# Patient Record
Sex: Female | Born: 1960
Health system: Southern US, Community
[De-identification: ages and names within clinical notes are randomized; demographics above are authoritative.]

## PROBLEM LIST (undated history)

## (undated) ENCOUNTER — Emergency Department (HOSPITAL_COMMUNITY): Disposition: A | Discharge status: Home / Self Care | Payer: BC Managed Care – PPO

## (undated) DIAGNOSIS — M1990 Unspecified osteoarthritis, unspecified site: Secondary | ICD-10-CM

## (undated) DIAGNOSIS — T7840XA Allergy, unspecified, initial encounter: Secondary | ICD-10-CM

## (undated) DIAGNOSIS — M810 Age-related osteoporosis without current pathological fracture: Secondary | ICD-10-CM

## (undated) DIAGNOSIS — I8390 Asymptomatic varicose veins of unspecified lower extremity: Secondary | ICD-10-CM

## (undated) HISTORY — PX: COLPOSCOPY: SHX161

## (undated) HISTORY — DX: Unspecified osteoarthritis, unspecified site: M19.90

## (undated) HISTORY — DX: Asymptomatic varicose veins of unspecified lower extremity: I83.90

## (undated) HISTORY — PX: ENDOMETRIAL ABLATION: SHX621

## (undated) HISTORY — DX: Age-related osteoporosis without current pathological fracture: M81.0

## (undated) HISTORY — DX: Allergy, unspecified, initial encounter: T78.40XA

---

## 1994-11-21 HISTORY — PX: PELVIC LAPAROSCOPY: SHX162

## 2002-02-18 ENCOUNTER — Other Ambulatory Visit: Admission: RE | Admit: 2002-02-18 | Discharge: 2002-02-18 | Payer: Self-pay | Admitting: Obstetrics and Gynecology

## 2003-03-19 ENCOUNTER — Other Ambulatory Visit: Admission: RE | Admit: 2003-03-19 | Discharge: 2003-03-19 | Payer: Self-pay | Admitting: Obstetrics and Gynecology

## 2004-03-31 ENCOUNTER — Other Ambulatory Visit: Admission: RE | Admit: 2004-03-31 | Discharge: 2004-03-31 | Payer: Self-pay | Admitting: Obstetrics and Gynecology

## 2004-05-04 ENCOUNTER — Other Ambulatory Visit: Admission: RE | Admit: 2004-05-04 | Discharge: 2004-05-04 | Payer: Self-pay | Admitting: Obstetrics and Gynecology

## 2004-11-01 ENCOUNTER — Other Ambulatory Visit: Admission: RE | Admit: 2004-11-01 | Discharge: 2004-11-01 | Payer: Self-pay | Admitting: Obstetrics and Gynecology

## 2005-01-03 ENCOUNTER — Encounter: Admission: RE | Admit: 2005-01-03 | Discharge: 2005-01-03 | Payer: Self-pay | Admitting: Family Medicine

## 2005-04-04 ENCOUNTER — Other Ambulatory Visit: Admission: RE | Admit: 2005-04-04 | Discharge: 2005-04-04 | Payer: Self-pay | Admitting: Obstetrics and Gynecology

## 2006-01-11 ENCOUNTER — Encounter: Admission: RE | Admit: 2006-01-11 | Discharge: 2006-01-11 | Payer: Self-pay | Admitting: Family Medicine

## 2006-04-10 ENCOUNTER — Other Ambulatory Visit: Admission: RE | Admit: 2006-04-10 | Discharge: 2006-04-10 | Payer: Self-pay | Admitting: Obstetrics and Gynecology

## 2007-02-05 ENCOUNTER — Encounter: Admission: RE | Admit: 2007-02-05 | Discharge: 2007-02-05 | Payer: Self-pay | Admitting: Obstetrics and Gynecology

## 2007-04-12 ENCOUNTER — Other Ambulatory Visit: Admission: RE | Admit: 2007-04-12 | Discharge: 2007-04-12 | Payer: Self-pay | Admitting: Obstetrics and Gynecology

## 2008-04-15 ENCOUNTER — Encounter: Admission: RE | Admit: 2008-04-15 | Discharge: 2008-04-15 | Payer: Self-pay | Admitting: Obstetrics and Gynecology

## 2008-04-15 ENCOUNTER — Other Ambulatory Visit: Admission: RE | Admit: 2008-04-15 | Discharge: 2008-04-15 | Payer: Self-pay | Admitting: Obstetrics and Gynecology

## 2009-05-22 ENCOUNTER — Other Ambulatory Visit: Admission: RE | Admit: 2009-05-22 | Discharge: 2009-05-22 | Payer: Self-pay | Admitting: Obstetrics and Gynecology

## 2009-05-22 ENCOUNTER — Encounter: Admission: RE | Admit: 2009-05-22 | Discharge: 2009-05-22 | Payer: Self-pay | Admitting: Obstetrics and Gynecology

## 2009-05-22 ENCOUNTER — Ambulatory Visit: Payer: Self-pay | Admitting: Obstetrics and Gynecology

## 2009-05-22 ENCOUNTER — Encounter: Payer: Self-pay | Admitting: Obstetrics and Gynecology

## 2009-11-21 HISTORY — PX: OTHER SURGICAL HISTORY: SHX169

## 2010-02-08 ENCOUNTER — Encounter: Payer: Self-pay | Admitting: Cardiology

## 2010-06-11 ENCOUNTER — Other Ambulatory Visit: Admission: RE | Admit: 2010-06-11 | Discharge: 2010-06-11 | Payer: Self-pay | Admitting: Obstetrics and Gynecology

## 2010-06-11 ENCOUNTER — Ambulatory Visit: Payer: Self-pay | Admitting: Obstetrics and Gynecology

## 2010-09-02 ENCOUNTER — Encounter: Admission: RE | Admit: 2010-09-02 | Discharge: 2010-09-02 | Payer: Self-pay | Admitting: Obstetrics and Gynecology

## 2010-12-21 NOTE — Letter (Signed)
Summary: [facility] 2.1.11 PROGRESS NOTE  [facility] 2.1.11 PROGRESS NOTE   Imported By: Faythe Ghee 02/08/2010 16:36:38  _____________________________________________________________________  External Attachment:    Type:   Image     Comment:   External Document

## 2011-03-04 ENCOUNTER — Other Ambulatory Visit: Payer: Self-pay | Admitting: Dermatology

## 2011-04-05 ENCOUNTER — Other Ambulatory Visit: Payer: Self-pay | Admitting: Dermatology

## 2011-07-26 ENCOUNTER — Other Ambulatory Visit: Payer: Self-pay | Admitting: Obstetrics and Gynecology

## 2011-07-26 DIAGNOSIS — Z1231 Encounter for screening mammogram for malignant neoplasm of breast: Secondary | ICD-10-CM

## 2011-07-27 ENCOUNTER — Encounter: Payer: Self-pay | Admitting: Obstetrics and Gynecology

## 2011-07-27 ENCOUNTER — Ambulatory Visit (INDEPENDENT_AMBULATORY_CARE_PROVIDER_SITE_OTHER): Payer: BC Managed Care – PPO | Admitting: Obstetrics and Gynecology

## 2011-07-27 ENCOUNTER — Other Ambulatory Visit (HOSPITAL_COMMUNITY)
Admission: RE | Admit: 2011-07-27 | Discharge: 2011-07-27 | Disposition: A | Discharge status: Home / Self Care | Payer: BC Managed Care – PPO | Source: Ambulatory Visit | Attending: Obstetrics and Gynecology | Admitting: Obstetrics and Gynecology

## 2011-07-27 VITALS — BP 106/68 | Ht 67.5 in | Wt 189.0 lb

## 2011-07-27 DIAGNOSIS — Z01419 Encounter for gynecological examination (general) (routine) without abnormal findings: Secondary | ICD-10-CM

## 2011-07-27 DIAGNOSIS — Z13220 Encounter for screening for lipoid disorders: Secondary | ICD-10-CM

## 2011-07-27 DIAGNOSIS — N951 Menopausal and female climacteric states: Secondary | ICD-10-CM

## 2011-07-27 DIAGNOSIS — Z780 Asymptomatic menopausal state: Secondary | ICD-10-CM

## 2011-07-27 DIAGNOSIS — R5383 Other fatigue: Secondary | ICD-10-CM

## 2011-07-27 DIAGNOSIS — N809 Endometriosis, unspecified: Secondary | ICD-10-CM | POA: Insufficient documentation

## 2011-07-27 NOTE — Progress Notes (Signed)
Patient came to see me today for an annual GYN exam. Her periods have been nonexistent since she had her endometrial ablation. She does have occasional spotting. Her big issues today  however are severe hot flashes and fatigue. She is up-to-date on mammograms.  Physical examination: HEENT within normal limits. Neck: Thyroid not large. No masses. Supraclavicular nodes: not enlarged. Breasts: Examined in both sitting midline position. No skin changes and no masses. Abdomen: Soft no guarding rebound or masses or hernia. Pelvic: External: Within normal limits. BUS: Within normal limits. Vaginal:within normal limits. Good estrogen effect. No evidence of cystocele rectocele or enterocele. Cervix: clean. Uterus: Normal size and shape. Adnexa: No masses. Rectovaginal exam: Confirmatory and negative. Extremities: Within normal limits.  Assessment: Hot flashes and fatigue  Plan: Appropriate lab work done. Patient to call me for results.

## 2011-07-27 NOTE — Patient Instructions (Signed)
Call for lab results. Hit 9.

## 2011-07-29 ENCOUNTER — Telehealth: Payer: Self-pay | Admitting: Obstetrics and Gynecology

## 2011-07-29 DIAGNOSIS — Z780 Asymptomatic menopausal state: Secondary | ICD-10-CM

## 2011-07-29 MED ORDER — MEDROXYPROGESTERONE ACETATE 10 MG PO TABS: ORAL_TABLET | ORAL | Status: DC

## 2011-07-29 MED ORDER — ESTRADIOL 1 MG PO TABS: 0.5000 mg | ORAL_TABLET | Freq: Every day | ORAL | Status: DC

## 2011-07-29 NOTE — Telephone Encounter (Signed)
I spoke with the patient today. Although she is now menopausal her FSH is 17. We discussed treatment because she is extremely fatigue as well as having hot flashes. Her thyroid was normal. We treated her with estradiol half a milligram daily. We also gave her medroxyprogesterone 5 mg day 1 the 12. Prescriptions called in.

## 2011-09-05 ENCOUNTER — Ambulatory Visit: Payer: Self-pay

## 2011-09-07 ENCOUNTER — Ambulatory Visit
Admission: RE | Admit: 2011-09-07 | Discharge: 2011-09-07 | Disposition: A | Discharge status: Home / Self Care | Payer: BC Managed Care – PPO | Source: Ambulatory Visit | Attending: Obstetrics and Gynecology | Admitting: Obstetrics and Gynecology

## 2011-09-07 DIAGNOSIS — Z1231 Encounter for screening mammogram for malignant neoplasm of breast: Secondary | ICD-10-CM

## 2011-11-10 IMAGING — MG MM DIGITAL SCREENING
4 series · 4 of 4 positions shown · non-contrast
Comparison: none

DG SCREEN MAMMOGRAM BILATERAL
Bilateral CC and MLO view(s) were taken.

DIGITAL SCREENING MAMMOGRAM WITH CAD:
The breast tissue is heterogeneously dense.  No masses or malignant type calcifications are 
identified.  Compared with prior studies.
Images were processed with CAD.

[R CC]
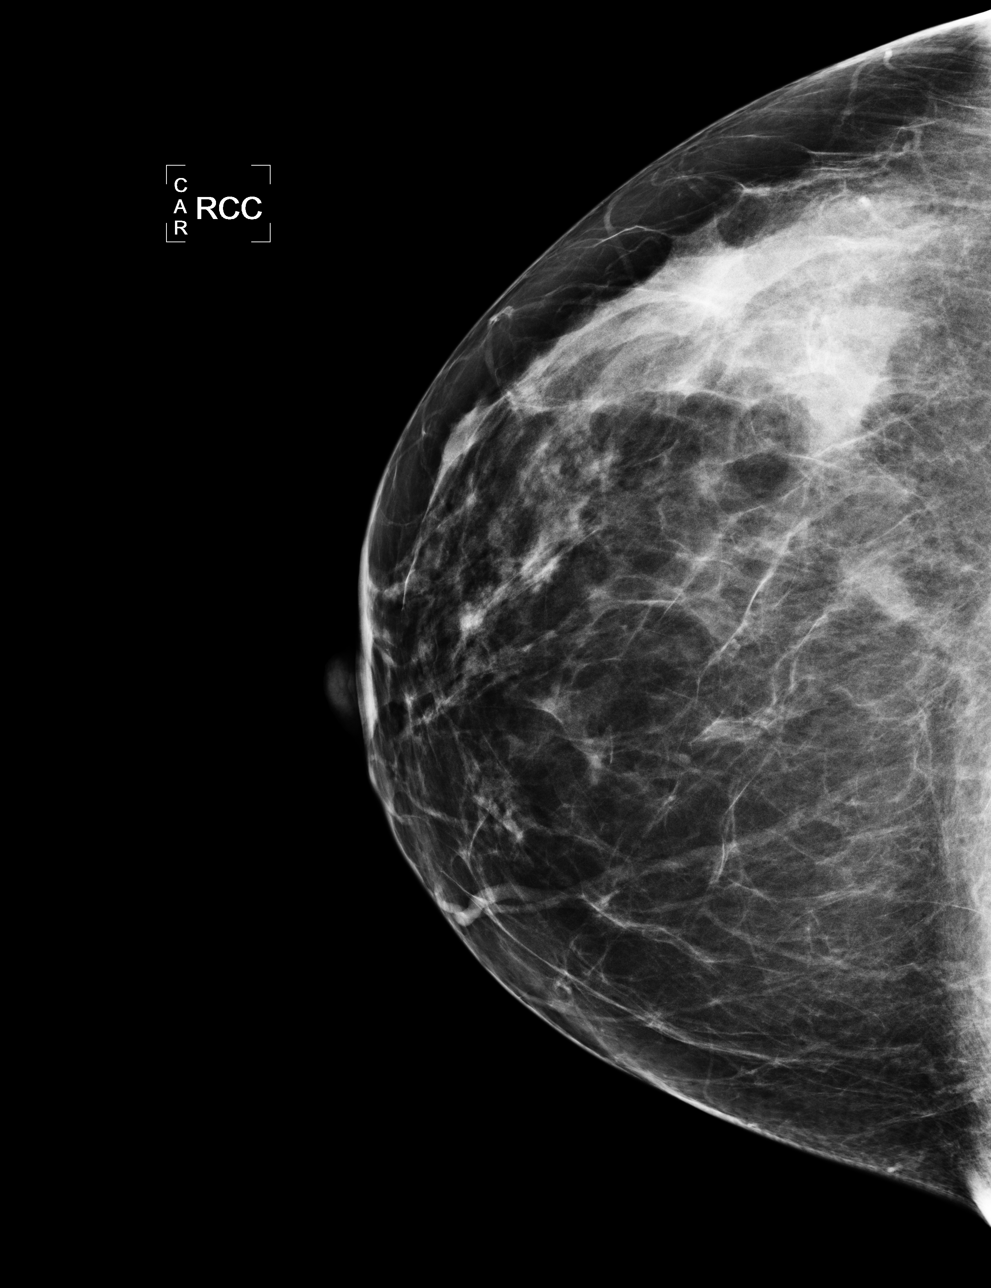

[L CC]
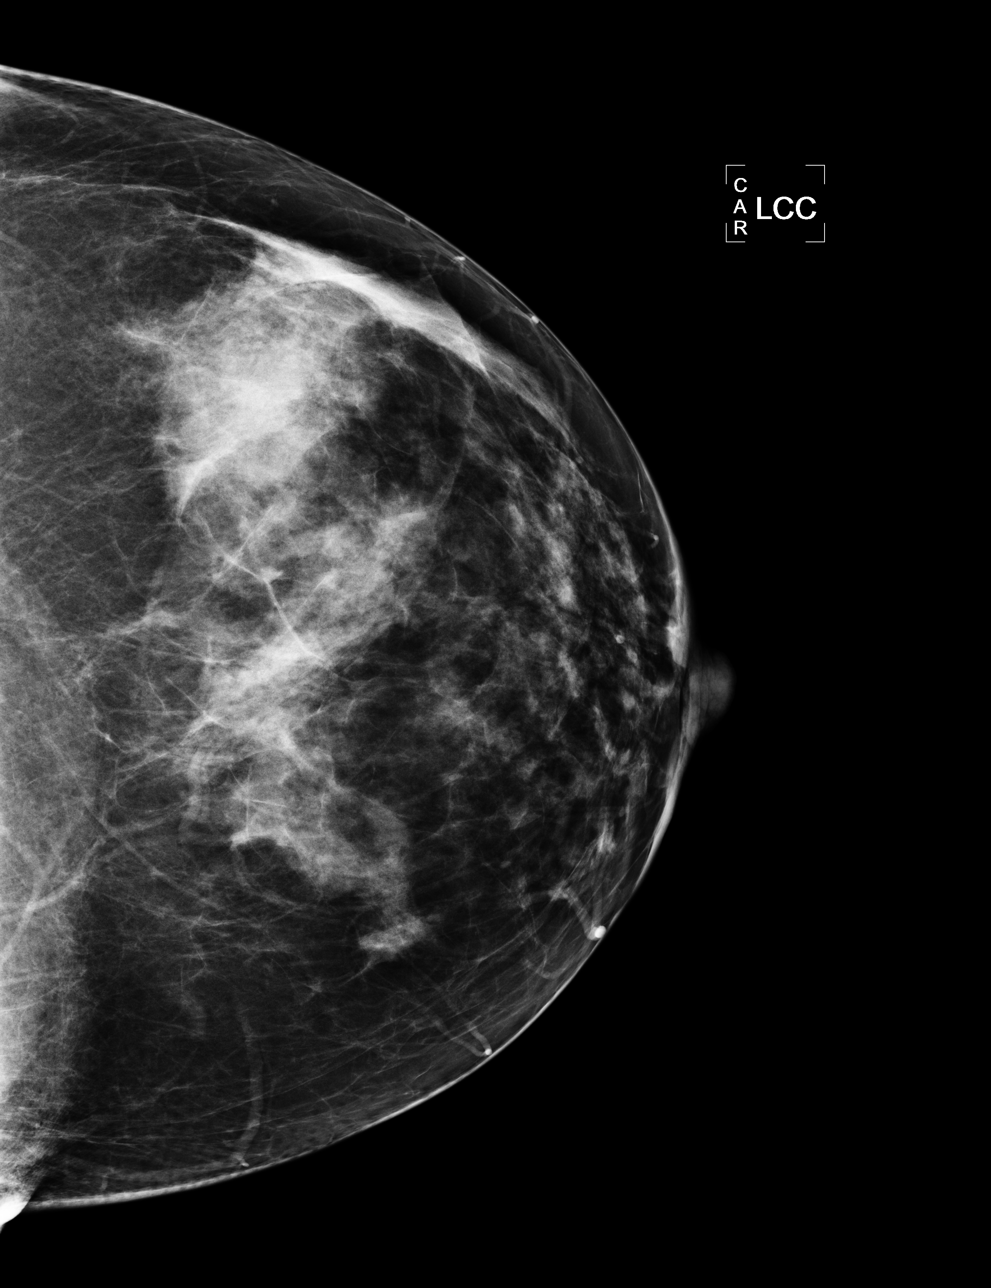

[L MLO]
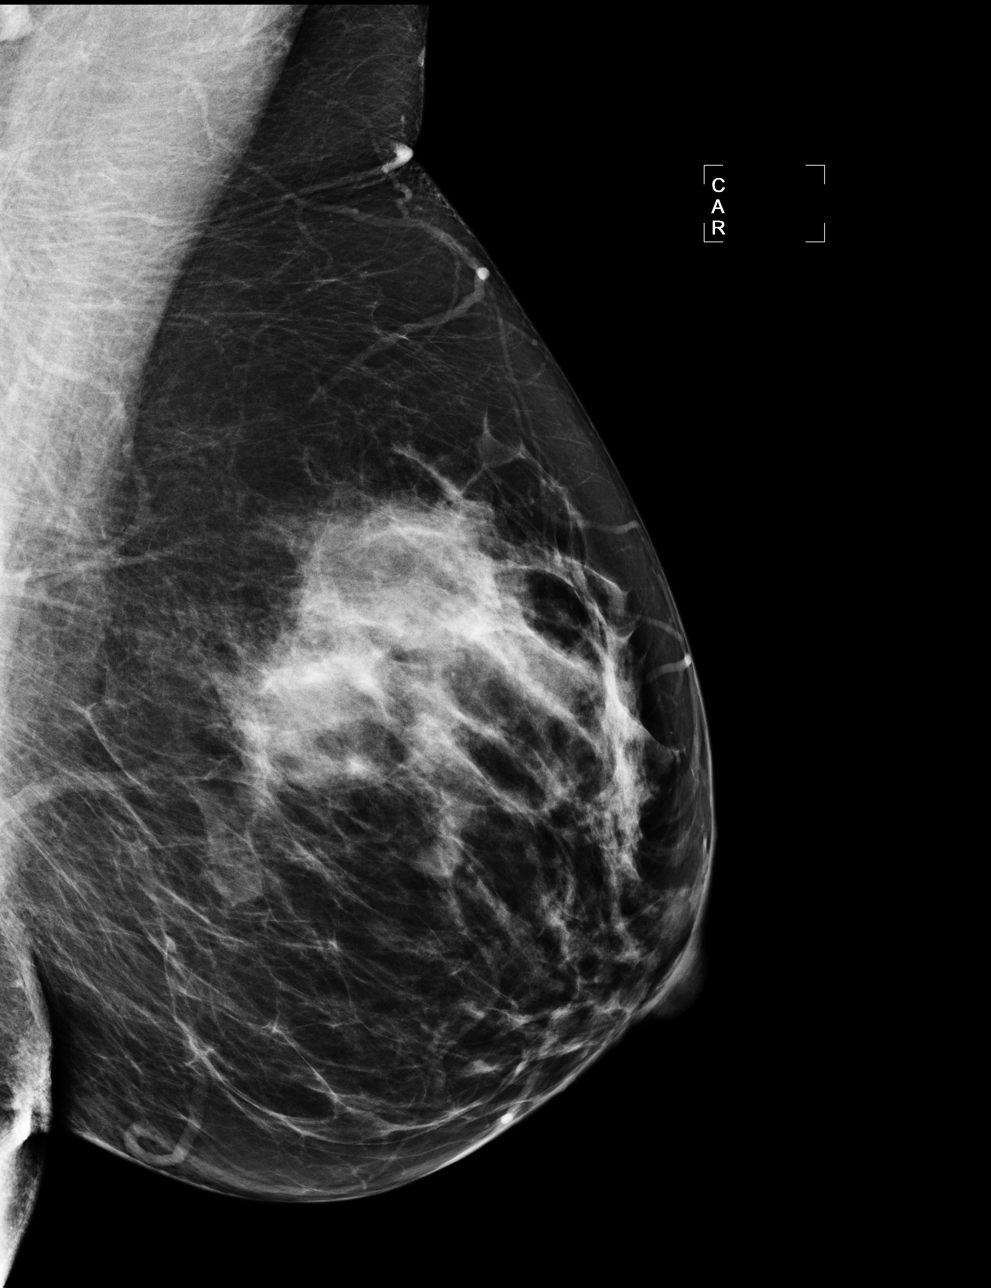

[R MLO]
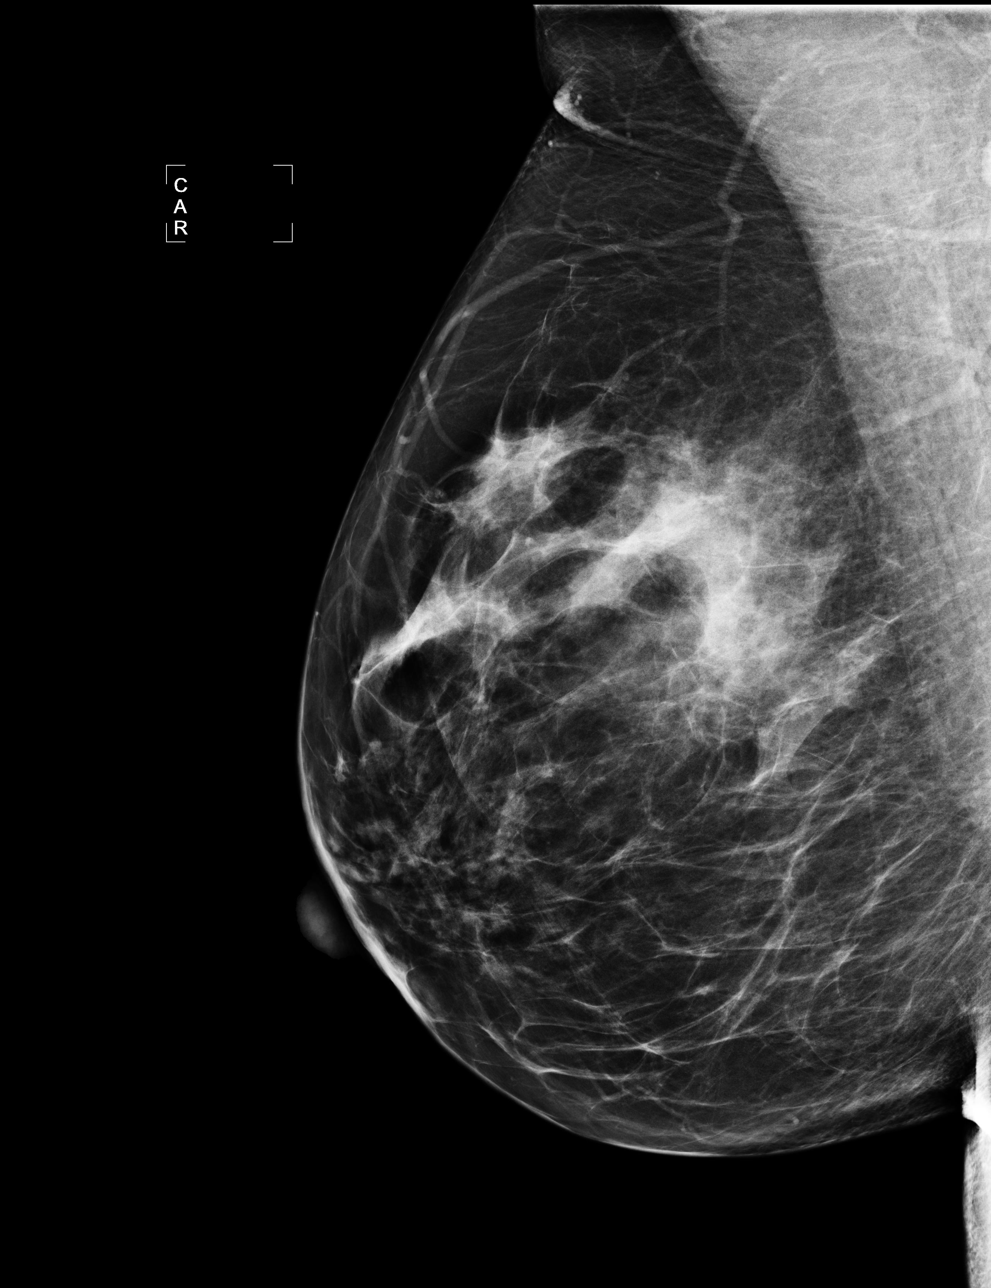

[4 of 4 positions shown; findings below may reference images not displayed]

IMPRESSION: No specific mammographic evidence of malignancy.  Next screening mammogram is recommended in one 
year.

A result letter of this screening mammogram will be mailed directly to the patient.

ASSESSMENT: Negative - BI-RADS 1

Screening mammogram in 1 year.
,

## 2012-07-09 ENCOUNTER — Other Ambulatory Visit: Payer: Self-pay | Admitting: Dermatology

## 2012-08-23 ENCOUNTER — Other Ambulatory Visit (HOSPITAL_COMMUNITY)
Admission: RE | Admit: 2012-08-23 | Discharge: 2012-08-23 | Disposition: A | Discharge status: Home / Self Care | Payer: BC Managed Care – PPO | Source: Ambulatory Visit | Attending: Obstetrics and Gynecology | Admitting: Obstetrics and Gynecology

## 2012-08-23 ENCOUNTER — Encounter: Payer: Self-pay | Admitting: Obstetrics and Gynecology

## 2012-08-23 ENCOUNTER — Ambulatory Visit (INDEPENDENT_AMBULATORY_CARE_PROVIDER_SITE_OTHER): Payer: BC Managed Care – PPO | Admitting: Obstetrics and Gynecology

## 2012-08-23 VITALS — BP 122/78 | Ht 67.0 in | Wt 174.0 lb

## 2012-08-23 DIAGNOSIS — Z833 Family history of diabetes mellitus: Secondary | ICD-10-CM

## 2012-08-23 DIAGNOSIS — F329 Major depressive disorder, single episode, unspecified: Secondary | ICD-10-CM | POA: Insufficient documentation

## 2012-08-23 DIAGNOSIS — N898 Other specified noninflammatory disorders of vagina: Secondary | ICD-10-CM

## 2012-08-23 DIAGNOSIS — Z780 Asymptomatic menopausal state: Secondary | ICD-10-CM

## 2012-08-23 DIAGNOSIS — Z01419 Encounter for gynecological examination (general) (routine) without abnormal findings: Secondary | ICD-10-CM

## 2012-08-23 DIAGNOSIS — N83209 Unspecified ovarian cyst, unspecified side: Secondary | ICD-10-CM | POA: Insufficient documentation

## 2012-08-23 DIAGNOSIS — F419 Anxiety disorder, unspecified: Secondary | ICD-10-CM | POA: Insufficient documentation

## 2012-08-23 LAB — CBC WITH DIFFERENTIAL/PLATELET
Basophils Absolute: 0.1 10*3/uL (ref 0.0–0.1)
Basophils Relative: 1 % (ref 0–1)
Eosinophils Absolute: 0.1 10*3/uL (ref 0.0–0.7)
Eosinophils Relative: 2 % (ref 0–5)
HCT: 39.3 % (ref 36.0–46.0)
Hemoglobin: 13.3 g/dL (ref 12.0–15.0)
Lymphocytes Relative: 45 % (ref 12–46)
MCH: 31.4 pg (ref 26.0–34.0)
MCHC: 33.8 g/dL (ref 30.0–36.0)
MCV: 92.9 fL (ref 78.0–100.0)
Monocytes Absolute: 0.5 10*3/uL (ref 0.1–1.0)
Monocytes Relative: 9 % (ref 3–12)
Neutrophils Relative %: 43 % (ref 43–77)
Platelets: 251 10*3/uL (ref 150–400)
RBC: 4.23 MIL/uL (ref 3.87–5.11)
RDW: 12.8 % (ref 11.5–15.5)
WBC: 6.1 10*3/uL (ref 4.0–10.5)

## 2012-08-23 LAB — WET PREP FOR TRICH, YEAST, CLUE
Trich, Wet Prep: NONE SEEN
Yeast Wet Prep HPF POC: NONE SEEN

## 2012-08-23 LAB — HEMOGLOBIN A1C
Hgb A1c MFr Bld: 5.4 % (ref <5.7)
Mean Plasma Glucose: 108 mg/dL (ref <117)

## 2012-08-23 MED ORDER — FLUCONAZOLE 150 MG PO TABS: 150.0000 mg | ORAL_TABLET | Freq: Every day | ORAL | Status: DC

## 2012-08-23 NOTE — Patient Instructions (Signed)
Schedule your mammogram;

## 2012-08-23 NOTE — Addendum Note (Signed)
Addended by: Dayna Barker on: 08/23/2012 11:15 AM   Modules accepted: Orders

## 2012-08-23 NOTE — Progress Notes (Signed)
Patient came to see me today for her annual GYN exam. Last year she was complaining of hot flashes. Her FSH was 17. We gave her a trial of HRT but she wasn't sure it helped and she did have some vaginal spotting. After her endometrial ablation she had amenorrhea for approximately 8 months and since then has had several very light periods. She has had basal cell cancers removed from her face recently. She has been on antibiotics and is having vaginal itching. She is having no pelvic pain. She is due for her yearly mammogram. She has not used contraception because of  female infertility. She's never had cervical dysplasia. Her last Pap smear was 2012.  Physical examination:Kim Julian Reil present. HEENT within normal limits. Neck: Thyroid not large. No masses. Supraclavicular nodes: not enlarged. Breasts: Examined in both sitting and lying  position. No skin changes and no masses. Abdomen: Soft no guarding rebound or masses or hernia. Pelvic: External: Within normal limits. BUS: Within normal limits. Vaginal:within normal limits. Good estrogen effect. No evidence of cystocele rectocele or enterocele. Cervix: clean. Uterus: Normal size and shape. Adnexa: No masses. Rectovaginal exam: Confirmatory and negative. Extremities: Within normal limits. Wet prep revealed clue cells but no yeast. Discharge consistent with yeast.  Assessment: #1. Yeast vaginitis #2. Transitional symptoms  Plan: Diflucan 150 mg daily for 2-3 days. Mammogram.The new Pap smear guidelines were discussed with the patient. Pap smear requested and done.

## 2012-08-24 LAB — URINALYSIS W MICROSCOPIC + REFLEX CULTURE
Bacteria, UA: NONE SEEN
Bilirubin Urine: NEGATIVE
Glucose, UA: NEGATIVE mg/dL
Hgb urine dipstick: NEGATIVE
Ketones, ur: NEGATIVE mg/dL
Nitrite: NEGATIVE
Protein, ur: NEGATIVE mg/dL
Urobilinogen, UA: 0.2 mg/dL (ref 0.0–1.0)
pH: 5.5 (ref 5.0–8.0)

## 2012-09-26 ENCOUNTER — Other Ambulatory Visit: Payer: Self-pay | Admitting: Obstetrics and Gynecology

## 2012-09-26 DIAGNOSIS — Z1231 Encounter for screening mammogram for malignant neoplasm of breast: Secondary | ICD-10-CM

## 2012-11-06 ENCOUNTER — Ambulatory Visit
Admission: RE | Admit: 2012-11-06 | Discharge: 2012-11-06 | Disposition: A | Discharge status: Home / Self Care | Payer: BC Managed Care – PPO | Source: Ambulatory Visit | Attending: Obstetrics and Gynecology | Admitting: Obstetrics and Gynecology

## 2012-11-06 DIAGNOSIS — Z1231 Encounter for screening mammogram for malignant neoplasm of breast: Secondary | ICD-10-CM

## 2013-05-30 ENCOUNTER — Other Ambulatory Visit: Payer: Self-pay

## 2013-05-30 NOTE — Telephone Encounter (Signed)
Last seen 1/14   MMM   If print please have nurse call patient to pick

## 2013-05-30 NOTE — Telephone Encounter (Signed)
Patient NTBS to see if has lost weight

## 2013-05-31 NOTE — Telephone Encounter (Signed)
Pharmacy aware,pt needs appointment.

## 2013-08-22 ENCOUNTER — Encounter: Payer: Self-pay | Admitting: Family Medicine

## 2013-08-22 ENCOUNTER — Ambulatory Visit (INDEPENDENT_AMBULATORY_CARE_PROVIDER_SITE_OTHER): Payer: BC Managed Care – PPO | Admitting: Family Medicine

## 2013-08-22 VITALS — BP 117/71 | HR 70 | Temp 96.8°F | Ht 67.0 in | Wt 178.0 lb

## 2013-08-22 DIAGNOSIS — J069 Acute upper respiratory infection, unspecified: Secondary | ICD-10-CM

## 2013-08-22 DIAGNOSIS — R062 Wheezing: Secondary | ICD-10-CM

## 2013-08-22 MED ORDER — METHYLPREDNISOLONE ACETATE 80 MG/ML IJ SUSP
80.0000 mg | Freq: Once | INTRAMUSCULAR | Status: AC
  Administered 2013-08-22: 80 mg via INTRAMUSCULAR

## 2013-08-22 MED ORDER — CEFDINIR 300 MG PO CAPS: 600.0000 mg | ORAL_CAPSULE | Freq: Every day | ORAL | Status: DC

## 2013-08-22 MED ORDER — METHYLPREDNISOLONE ACETATE 40 MG/ML IJ SUSP: 80.0000 mg | Freq: Once | INTRAMUSCULAR | Status: DC

## 2013-08-22 NOTE — Progress Notes (Signed)
New Patient History and Physical  Patient name: Melanie Alvarado Medical record number: [id number] Date of birth: 09-24-1961 Age: 52 y.o. Gender: female  Primary Care Provider: Rudi Heap, MD  Chief Complaint: URI   History of Present Illness: URI Symptoms Onset: 4 days  Description: rhinorrhea, nasal congestion, cough, mild wheezing  Modifying factors:  Prior remote smoking history   Symptoms Nasal discharge: yes Fever: no Sore throat: no Cough: yes Wheezing: yes Ear pain: no GI symptoms: no Sick contacts: no  Red Flags  Stiff neck: no Dyspnea: no Rash: no Swallowing difficulty: no  Sinusitis Risk Factors Headache/face pain: no Double sickening: no tooth pain: no  Allergy Risk Factors Sneezing: no Itchy scratchy throat: no Seasonal symptoms: no  Flu Risk Factors Headache: no muscle aches: no severe fatigue: no     Past Medical History: Patient Active Problem List   Diagnosis Date Noted  . Ovarian cyst   . Anxiety   . Depression   . Endometriosis    Past Medical History  Diagnosis Date  . Endometriosis   . Ovarian cyst     LEFT  . Anxiety   . Depression     Past Surgical History: Past Surgical History  Procedure Laterality Date  . Pelvic laparoscopy  1996    LASER OF ENDOMETRIOSIS,DRAIN LEFT OV CYST  . Excision of basal cell  2011  . Colposcopy    . Endometrial ablation      HER OPTION    Social History: History   Social History  . Marital Status: Married    Spouse Name: N/A    Number of Children: N/A  . Years of Education: N/A   Social History Main Topics  . Smoking status: Former Smoker    Quit date: 08/22/1993  . Smokeless tobacco: Never Used  . Alcohol Use: Yes     Comment: Rare  . Drug Use: No  . Sexual Activity: Yes    Birth Control/ Protection: None   Other Topics Concern  . None   Social History Narrative  . None    Family History: Family History  Problem Relation Age of Onset  . Hypertension Paternal  Grandmother   . Cancer Paternal Grandmother     BONE, LUNG CANCER  . Stroke Father   . Pancreatic cancer Maternal Grandmother     Allergies: Allergies  Allergen Reactions  . Erythromycin     REPORTS ? ALLERGY TO "MYCINS".    Current Outpatient Prescriptions  Medication Sig Dispense Refill  . triamcinolone (NASACORT) 55 MCG/ACT nasal inhaler Place 2 sprays into the nose daily.       No current facility-administered medications for this visit.   Review Of Systems: 12 point ROS negative except as noted above in HPI.  Physical Exam: Filed Vitals:   08/22/13 1320  BP: 117/71  Pulse: 70  Temp: 96.8 F (36 C)    General: alert and cooperative HEENT: PERRLA and extra ocular movement intact, +nasal erythema, rhinorrhea bilaterally, + post oropharyngeal erythema  Heart: S1, S2 normal, no murmur, rub or gallop, regular rate and rhythm Lungs: unlabored breathing and faint wheezes  Abdomen: abdomen is soft without significant tenderness, masses, organomegaly or guarding Extremities: extremities normal, atraumatic, no cyanosis or edema Skin:no rashes, no ecchymoses Neurology: normal without focal findings  Labs and Imaging:  Assessment and Plan: URI (upper respiratory infection) - Plan: cefdinir (OMNICEF) 300 MG capsule  Wheezing - Plan: methylPREDNISolone acetate (DEPO-MEDROL) injection 80 mg  Likley viral process Depomedrol for wheezing.  Omnicef for resp coverage if sxs fail to improved in 5-7 days.  Discussed infectious and resp red flags.  Follow up as needed.         Doree Albee MD

## 2013-08-22 NOTE — Addendum Note (Signed)
Addended by: Gwenith Daily on: 08/22/2013 03:15 PM   Modules accepted: Orders

## 2013-11-19 ENCOUNTER — Encounter: Payer: Self-pay | Admitting: Family Medicine

## 2013-11-19 ENCOUNTER — Ambulatory Visit (INDEPENDENT_AMBULATORY_CARE_PROVIDER_SITE_OTHER): Payer: BC Managed Care – PPO | Admitting: Family Medicine

## 2013-11-19 VITALS — BP 114/76 | HR 84 | Temp 98.6°F | Ht 67.0 in | Wt 188.0 lb

## 2013-11-19 DIAGNOSIS — J069 Acute upper respiratory infection, unspecified: Secondary | ICD-10-CM

## 2013-11-19 DIAGNOSIS — B009 Herpesviral infection, unspecified: Secondary | ICD-10-CM

## 2013-11-19 DIAGNOSIS — B001 Herpesviral vesicular dermatitis: Secondary | ICD-10-CM

## 2013-11-19 MED ORDER — AZITHROMYCIN 250 MG PO TABS: ORAL_TABLET | ORAL | Status: DC

## 2013-11-19 MED ORDER — TRIAMCINOLONE ACETONIDE 40 MG/ML IJ SUSP: 60.0000 mg | Freq: Once | INTRAMUSCULAR | Status: DC

## 2013-11-19 MED ORDER — CEFDINIR 300 MG PO CAPS: 600.0000 mg | ORAL_CAPSULE | Freq: Every day | ORAL | Status: DC

## 2013-11-19 MED ORDER — OSELTAMIVIR PHOSPHATE 75 MG PO CAPS: 75.0000 mg | ORAL_CAPSULE | Freq: Two times a day (BID) | ORAL | Status: DC

## 2013-11-19 NOTE — Progress Notes (Signed)
   Subjective:    Patient ID: Melanie Alvarado, female    DOB: 05-25-1961, 52 y.o.   MRN: [id number]  HPI This 52 y.o. female presents for evaluation of uri sx's for over 2 days and she has been having Fatigue and she wants refill on cefdinivir which works well for cold sores.   Review of Systems C/o uri sx's and cold sore No chest pain, SOB, HA, dizziness, vision change, N/V, diarrhea, constipation, dysuria, urinary urgency or frequency, myalgias, arthralgias or rash.     Objective:   Physical Exam  Vital signs noted  Well developed well nourished female.  HEENT - Head atraumatic Normocephalic                Eyes - PERRLA, Conjuctiva - clear Sclera- Clear EOMI                Ears - EAC's Wnl TM's Wnl Gross Hearing WNL                Throat - oropharanx wnl Respiratory - Lungs CTA bilateral Cardiac - RRR S1 and S2 without murmur GI - Abdomen soft Nontender and bowel sounds active x 4 Extremities - No edema. Neuro - Grossly intact.      Assessment & Plan:  URI (upper respiratory infection) - Plan: cefdinir (OMNICEF) 300 MG capsule, triamcinolone acetonide (KENALOG-40) injection 60 mg  Cold sore - Plan: cefdinir (OMNICEF) 300 MG capsule po as directed  Push po fluids, rest, tylenol and motrin otc prn as directed for fever, arthralgias, and myalgias.  Follow up prn if sx's continue or persist.  Deatra Canter FNP

## 2013-11-19 NOTE — Patient Instructions (Signed)

## 2013-11-26 ENCOUNTER — Other Ambulatory Visit: Payer: Self-pay

## 2013-11-26 DIAGNOSIS — Z1231 Encounter for screening mammogram for malignant neoplasm of breast: Secondary | ICD-10-CM

## 2013-12-06 ENCOUNTER — Ambulatory Visit: Payer: BC Managed Care – PPO | Admitting: Family Medicine

## 2013-12-12 ENCOUNTER — Other Ambulatory Visit: Payer: Self-pay | Admitting: *Deleted

## 2013-12-12 NOTE — Telephone Encounter (Signed)
This medication was not on pt med list

## 2013-12-13 MED ORDER — VALACYCLOVIR HCL 1 G PO TABS: ORAL_TABLET | ORAL | Status: DC

## 2013-12-25 ENCOUNTER — Ambulatory Visit: Payer: BC Managed Care – PPO

## 2013-12-25 ENCOUNTER — Encounter: Payer: BC Managed Care – PPO | Admitting: Gynecology

## 2013-12-30 ENCOUNTER — Ambulatory Visit (INDEPENDENT_AMBULATORY_CARE_PROVIDER_SITE_OTHER): Payer: BC Managed Care – PPO | Admitting: Family Medicine

## 2013-12-30 ENCOUNTER — Encounter: Payer: Self-pay | Admitting: Family Medicine

## 2013-12-30 ENCOUNTER — Telehealth: Payer: Self-pay | Admitting: Family Medicine

## 2013-12-30 VITALS — BP 115/77 | HR 77 | Temp 98.1°F | Ht 67.0 in | Wt 185.8 lb

## 2013-12-30 DIAGNOSIS — J209 Acute bronchitis, unspecified: Secondary | ICD-10-CM

## 2013-12-30 MED ORDER — KETOROLAC TROMETHAMINE 60 MG/2ML IM SOLN: 60.0000 mg | Freq: Once | INTRAMUSCULAR | Status: AC

## 2013-12-30 MED ORDER — AZITHROMYCIN 250 MG PO TABS: ORAL_TABLET | ORAL | Status: DC

## 2013-12-30 MED ORDER — TRIAMCINOLONE ACETONIDE 40 MG/ML IJ SUSP
40.0000 mg | Freq: Once | INTRAMUSCULAR | Status: AC
  Administered 2013-12-30: 40 mg via INTRAMUSCULAR

## 2013-12-30 MED ORDER — BENZONATATE 100 MG PO CAPS: 100.0000 mg | ORAL_CAPSULE | Freq: Three times a day (TID) | ORAL | Status: DC | PRN

## 2013-12-30 NOTE — Progress Notes (Signed)
   Subjective:    Patient ID: Melanie Alvarado, female    DOB: 05/14/1961, 53 y.o.   MRN: [id number]  HPI This 53 y.o. female presents for evaluation of URI sx's and cough.   Review of Systems No chest pain, SOB, HA, dizziness, vision change, N/V, diarrhea, constipation, dysuria, urinary urgency or frequency, myalgias, arthralgias or rash.     Objective:   Physical Exam Vital signs noted  Well developed well nourished female.  HEENT - Head atraumatic Normocephalic                Eyes - PERRLA, Conjuctiva - clear Sclera- Clear EOMI                Ears - EAC's Wnl TM's Wnl Gross Hearing WNL                Nose - Nares patent                 Throat - oropharanx wnl Respiratory - Lungs CTA bilateral Cardiac - RRR S1 and S2 without murmur GI - Abdomen soft Nontender and bowel sounds active x 4        Assessment & Plan:  Acute bronchitis - Plan: azithromycin (ZITHROMAX) 250 MG tablet, ketorolac (TORADOL) injection 60 mg, benzonatate (TESSALON PERLES) 100 MG capsule Push po fluids, rest, tylenol and motrin otc prn as directed for fever, arthralgias, and myalgias.  Follow up prn if sx's continue or persist. Deatra CanterWilliam J Glynnis Gavel FNP

## 2013-12-30 NOTE — Telephone Encounter (Signed)
appt today with bill 

## 2013-12-30 NOTE — Addendum Note (Signed)
Addended by: Bearl MulberryUTHERFORD, NATALIE K on: 12/30/2013 05:27 PM   Modules accepted: Orders

## 2014-01-03 ENCOUNTER — Other Ambulatory Visit: Payer: Self-pay | Admitting: Dermatology

## 2014-01-23 ENCOUNTER — Ambulatory Visit: Payer: BC Managed Care – PPO

## 2014-01-23 ENCOUNTER — Encounter: Payer: BC Managed Care – PPO | Admitting: Gynecology

## 2014-01-29 ENCOUNTER — Ambulatory Visit
Admission: RE | Admit: 2014-01-29 | Discharge: 2014-01-29 | Disposition: A | Discharge status: Home / Self Care | Payer: BC Managed Care – PPO | Source: Ambulatory Visit

## 2014-01-29 DIAGNOSIS — Z1231 Encounter for screening mammogram for malignant neoplasm of breast: Secondary | ICD-10-CM

## 2014-02-05 ENCOUNTER — Encounter: Payer: Self-pay | Admitting: Family Medicine

## 2014-02-05 ENCOUNTER — Ambulatory Visit (INDEPENDENT_AMBULATORY_CARE_PROVIDER_SITE_OTHER): Payer: BC Managed Care – PPO | Admitting: Family Medicine

## 2014-02-05 VITALS — BP 113/75 | HR 78 | Temp 96.8°F | Ht 67.0 in | Wt 189.0 lb

## 2014-02-05 DIAGNOSIS — J209 Acute bronchitis, unspecified: Secondary | ICD-10-CM

## 2014-02-05 MED ORDER — FLUCONAZOLE 150 MG PO TABS: 150.0000 mg | ORAL_TABLET | Freq: Once | ORAL | Status: DC

## 2014-02-05 MED ORDER — LEVOFLOXACIN 500 MG PO TABS: 500.0000 mg | ORAL_TABLET | Freq: Every day | ORAL | Status: DC

## 2014-02-05 MED ORDER — METHYLPREDNISOLONE (PAK) 4 MG PO TABS: ORAL_TABLET | ORAL | Status: DC

## 2014-02-05 MED ORDER — METHYLPREDNISOLONE ACETATE 80 MG/ML IJ SUSP
80.0000 mg | Freq: Once | INTRAMUSCULAR | Status: AC
  Administered 2014-02-05: 80 mg via INTRAMUSCULAR

## 2014-02-05 NOTE — Progress Notes (Signed)
   Subjective:    Patient ID: Erin SonsCarla H Kunath, female    DOB: 06/22/1961, 53 y.o.   MRN: [id number]  HPI  This 53 y.o. female presents for evaluation of URI sx's.  Review of Systems No chest pain, SOB, HA, dizziness, vision change, N/V, diarrhea, constipation, dysuria, urinary urgency or frequency, myalgias, arthralgias or rash.     Objective:   Physical Exam Vital signs noted  Well developed well nourished female.  HEENT - Head atraumatic Normocephalic                Eyes - PERRLA, Conjuctiva - clear Sclera- Clear EOMI                Ears - EAC's Wnl TM's Wnl Gross Hearing WNL                Throat - oropharanx wnl Respiratory - Lungs CTA bilateral Cardiac - RRR S1 and S2 without murmur GI - Abdomen soft Nontender and bowel sounds active x 4 Extremities - No edema. Neuro - Grossly intact.       Assessment & Plan:  Acute bronchitis - Plan: levofloxacin (LEVAQUIN) 500 MG tablet, fluconazole (DIFLUCAN) 150 MG tablet, methylPREDNIsolone (MEDROL DOSPACK) 4 MG tablet, methylPREDNISolone acetate (DEPO-MEDROL) injection 80 mg.  Deatra CanterWilliam J Kaladin Noseworthy FNP

## 2014-02-11 ENCOUNTER — Encounter: Payer: Self-pay | Admitting: Pharmacist Clinician (PhC)/ Clinical Pharmacy Specialist

## 2014-02-11 ENCOUNTER — Ambulatory Visit (INDEPENDENT_AMBULATORY_CARE_PROVIDER_SITE_OTHER): Payer: BC Managed Care – PPO | Admitting: Pharmacist Clinician (PhC)/ Clinical Pharmacy Specialist

## 2014-02-11 DIAGNOSIS — R635 Abnormal weight gain: Secondary | ICD-10-CM

## 2014-02-11 DIAGNOSIS — E669 Obesity, unspecified: Secondary | ICD-10-CM

## 2014-02-11 MED ORDER — PHENTERMINE HCL 37.5 MG PO CAPS: 37.5000 mg | ORAL_CAPSULE | ORAL | Status: DC

## 2014-02-11 NOTE — Progress Notes (Signed)
Donnie CoffinCarla Alvarado is a 53 year old female who has gained weight back since I last worked with her due to chronic prednisone treatment over the winter for bronchitis.  She comes in today for education on changing her eating habits and exercise to prevent longterm health complications associated with obesity.  She is 5 7" and 188lbs.  She would like to have 150lb as her weight loss goal.  Her daughter is also going to follow the diet with her.  Patient had taken phentermine several years ago to jump start her weight loss and is requesting it again today.  I called in phentermine 37.5mg  1 poqd for 30 days and 2 refills to the drug store.  Her BP and P are normal, she will have them both checked two weeks after starting phentermine.  I made a custom meal plan and exercise plan out for patient to follow.

## 2014-02-18 ENCOUNTER — Other Ambulatory Visit (HOSPITAL_COMMUNITY)
Admission: RE | Admit: 2014-02-18 | Discharge: 2014-02-18 | Disposition: A | Discharge status: Home / Self Care | Payer: BC Managed Care – PPO | Source: Ambulatory Visit | Attending: Gynecology | Admitting: Gynecology

## 2014-02-18 ENCOUNTER — Ambulatory Visit (INDEPENDENT_AMBULATORY_CARE_PROVIDER_SITE_OTHER): Payer: BC Managed Care – PPO | Admitting: Gynecology

## 2014-02-18 ENCOUNTER — Encounter: Payer: Self-pay | Admitting: Gynecology

## 2014-02-18 VITALS — BP 120/76 | Ht 67.0 in | Wt 182.0 lb

## 2014-02-18 DIAGNOSIS — Z01419 Encounter for gynecological examination (general) (routine) without abnormal findings: Secondary | ICD-10-CM | POA: Insufficient documentation

## 2014-02-18 LAB — CBC WITH DIFFERENTIAL/PLATELET
Basophils Absolute: 0.1 10*3/uL (ref 0.0–0.1)
Basophils Relative: 1 % (ref 0–1)
EOS ABS: 0.1 10*3/uL (ref 0.0–0.7)
EOS PCT: 1 % (ref 0–5)
HEMATOCRIT: 42.1 % (ref 36.0–46.0)
Hemoglobin: 14.7 g/dL (ref 12.0–15.0)
LYMPHS PCT: 26 % (ref 12–46)
Lymphs Abs: 2 10*3/uL (ref 0.7–4.0)
MCH: 31.7 pg (ref 26.0–34.0)
MCHC: 34.9 g/dL (ref 30.0–36.0)
MCV: 90.9 fL (ref 78.0–100.0)
MONO ABS: 0.6 10*3/uL (ref 0.1–1.0)
MONOS PCT: 8 % (ref 3–12)
Neutro Abs: 4.9 10*3/uL (ref 1.7–7.7)
Neutrophils Relative %: 64 % (ref 43–77)
Platelets: 251 10*3/uL (ref 150–400)
RBC: 4.63 MIL/uL (ref 3.87–5.11)
RDW: 13.2 % (ref 11.5–15.5)
WBC: 7.6 10*3/uL (ref 4.0–10.5)

## 2014-02-18 LAB — VITAMIN D 25 HYDROXY (VIT D DEFICIENCY, FRACTURES): VIT D 25 HYDROXY: 42 ng/mL (ref 30–89)

## 2014-02-18 LAB — LIPID PANEL
CHOL/HDL RATIO: 2.6 ratio
CHOLESTEROL: 178 mg/dL (ref 0–200)
HDL: 68 mg/dL (ref >39)
LDL Cholesterol: 100 mg/dL — ABNORMAL HIGH (ref 0–99)
TRIGLYCERIDES: 49 mg/dL (ref <150)
VLDL: 10 mg/dL (ref 0–40)

## 2014-02-18 LAB — COMPREHENSIVE METABOLIC PANEL
ALT: 15 U/L (ref 0–35)
AST: 17 U/L (ref 0–37)
Albumin: 4.1 g/dL (ref 3.5–5.2)
Alkaline Phosphatase: 74 U/L (ref 39–117)
BILIRUBIN TOTAL: 0.7 mg/dL (ref 0.2–1.2)
BUN: 8 mg/dL (ref 6–23)
CHLORIDE: 104 meq/L (ref 96–112)
CO2: 25 meq/L (ref 19–32)
CREATININE: 0.72 mg/dL (ref 0.50–1.10)
Calcium: 8.9 mg/dL (ref 8.4–10.5)
GLUCOSE: 83 mg/dL (ref 70–99)
Potassium: 3.8 mEq/L (ref 3.5–5.3)
Sodium: 137 mEq/L (ref 135–145)
Total Protein: 6.4 g/dL (ref 6.0–8.3)

## 2014-02-18 LAB — TSH: TSH: 0.817 u[IU]/mL (ref 0.350–4.500)

## 2014-02-18 NOTE — Patient Instructions (Signed)
Schedule colonoscopy with [facility] at [phone] or [facility] gastroenterology at [phone] Followup in one year for annual exam.  You may obtain a copy of any labs that were done today by logging onto MyChart as outlined in the instructions provided with your AVS (after visit summary). The office will not call with normal lab results but certainly if there are any significant abnormalities then we will contact you.   Health Maintenance, Female A healthy lifestyle and preventative care can promote health and wellness.  Maintain regular health, dental, and eye exams.  Eat a healthy diet. Foods like vegetables, fruits, whole grains, low-fat dairy products, and lean protein foods contain the nutrients you need without too many calories. Decrease your intake of foods high in solid fats, added sugars, and salt. Get information about a proper diet from your caregiver, if necessary.  Regular physical exercise is one of the most important things you can do for your health. Most adults should get at least 150 minutes of moderate-intensity exercise (any activity that increases your heart rate and causes you to sweat) each week. In addition, most adults need muscle-strengthening exercises on 2 or more days a week.   Maintain a healthy weight. The body mass index (BMI) is a screening tool to identify possible weight problems. It provides an estimate of body fat based on height and weight. Your caregiver can help determine your BMI, and can help you achieve or maintain a healthy weight. For adults 20 years and older:  A BMI below 18.5 is considered underweight.  A BMI of 18.5 to 24.9 is normal.  A BMI of 25 to 29.9 is considered overweight.  A BMI of 30 and above is considered obese.  Maintain normal blood lipids and cholesterol by exercising and minimizing your intake of saturated fat. Eat a balanced diet with plenty of fruits and vegetables. Blood tests for lipids and cholesterol  should begin at age 25 and be repeated every 5 years. If your lipid or cholesterol levels are high, you are over 50, or you are a high risk for heart disease, you may need your cholesterol levels checked more frequently.Ongoing high lipid and cholesterol levels should be treated with medicines if diet and exercise are not effective.  If you smoke, find out from your caregiver how to quit. If you do not use tobacco, do not start.  Lung cancer screening is recommended for adults aged 33 80 years who are at high risk for developing lung cancer because of a history of smoking. Yearly low-dose computed tomography (CT) is recommended for people who have at least a 30-pack-year history of smoking and are a current smoker or have quit within the past 15 years. A pack year of smoking is smoking an average of 1 pack of cigarettes a day for 1 year (for example: 1 pack a day for 30 years or 2 packs a day for 15 years). Yearly screening should continue until the smoker has stopped smoking for at least 15 years. Yearly screening should also be stopped for people who develop a health problem that would prevent them from having lung cancer treatment.  If you are pregnant, do not drink alcohol. If you are breastfeeding, be very cautious about drinking alcohol. If you are not pregnant and choose to drink alcohol, do not exceed 1 drink per day. One drink is considered to be 12 ounces (355 mL) of beer, 5 ounces (148 mL) of wine, or 1.5 ounces (44 mL) of liquor.  Avoid use  of street drugs. Do not share needles with anyone. Ask for help if you need support or instructions about stopping the use of drugs.  High blood pressure causes heart disease and increases the risk of stroke. Blood pressure should be checked at least every 1 to 2 years. Ongoing high blood pressure should be treated with medicines, if weight loss and exercise are not effective.  If you are 12 to 51 years old, ask your caregiver if you should take aspirin  to prevent strokes.  Diabetes screening involves taking a blood sample to check your fasting blood sugar level. This should be done once every 3 years, after age 5, if you are within normal weight and without risk factors for diabetes. Testing should be considered at a younger age or be carried out more frequently if you are overweight and have at least 1 risk factor for diabetes.  Breast cancer screening is essential preventative care for women. You should practice "breast self-awareness." This means understanding the normal appearance and feel of your breasts and may include breast self-examination. Any changes detected, no matter how small, should be reported to a caregiver. Women in their 8s and 30s should have a clinical breast exam (CBE) by a caregiver as part of a regular health exam every 1 to 3 years. After age 64, women should have a CBE every year. Starting at age 59, women should consider having a mammogram (breast X-ray) every year. Women who have a family history of breast cancer should talk to their caregiver about genetic screening. Women at a high risk of breast cancer should talk to their caregiver about having an MRI and a mammogram every year.  Breast cancer gene (BRCA)-related cancer risk assessment is recommended for women who have family members with BRCA-related cancers. BRCA-related cancers include breast, ovarian, tubal, and peritoneal cancers. Having family members with these cancers may be associated with an increased risk for harmful changes (mutations) in the breast cancer genes BRCA1 and BRCA2. Results of the assessment will determine the need for genetic counseling and BRCA1 and BRCA2 testing.  The Pap test is a screening test for cervical cancer. Women should have a Pap test starting at age 66. Between ages 57 and 73, Pap tests should be repeated every 2 years. Beginning at age 62, you should have a Pap test every 3 years as long as the past 3 Pap tests have been normal. If  you had a hysterectomy for a problem that was not cancer or a condition that could lead to cancer, then you no longer need Pap tests. If you are between ages 41 and 85, and you have had normal Pap tests going back 10 years, you no longer need Pap tests. If you have had past treatment for cervical cancer or a condition that could lead to cancer, you need Pap tests and screening for cancer for at least 20 years after your treatment. If Pap tests have been discontinued, risk factors (such as a new sexual partner) need to be reassessed to determine if screening should be resumed. Some women have medical problems that increase the chance of getting cervical cancer. In these cases, your caregiver may recommend more frequent screening and Pap tests.  The human papillomavirus (HPV) test is an additional test that may be used for cervical cancer screening. The HPV test looks for the virus that can cause the cell changes on the cervix. The cells collected during the Pap test can be tested for HPV. The HPV test  could be used to screen women aged 13 years and older, and should be used in women of any age who have unclear Pap test results. After the age of 77, women should have HPV testing at the same frequency as a Pap test.  Colorectal cancer can be detected and often prevented. Most routine colorectal cancer screening begins at the age of 10 and continues through age 46. However, your caregiver may recommend screening at an earlier age if you have risk factors for colon cancer. On a yearly basis, your caregiver may provide home test kits to check for hidden blood in the stool. Use of a small camera at the end of a tube, to directly examine the colon (sigmoidoscopy or colonoscopy), can detect the earliest forms of colorectal cancer. Talk to your caregiver about this at age 16, when routine screening begins. Direct examination of the colon should be repeated every 5 to 10 years through age 61, unless early forms of  pre-cancerous polyps or small growths are found.  Hepatitis C blood testing is recommended for all people born from [birth year] through 1963 and any individual with known risks for hepatitis C.  Practice safe sex. Use condoms and avoid high-risk sexual practices to reduce the spread of sexually transmitted infections (STIs). Sexually active women aged 33 and younger should be checked for Chlamydia, which is a common sexually transmitted infection. Older women with new or multiple partners should also be tested for Chlamydia. Testing for other STIs is recommended if you are sexually active and at increased risk.  Osteoporosis is a disease in which the bones lose minerals and strength with aging. This can result in serious bone fractures. The risk of osteoporosis can be identified using a bone density scan. Women ages 69 and over and women at risk for fractures or osteoporosis should discuss screening with their caregivers. Ask your caregiver whether you should be taking a calcium supplement or vitamin D to reduce the rate of osteoporosis.  Menopause can be associated with physical symptoms and risks. Hormone replacement therapy is available to decrease symptoms and risks. You should talk to your caregiver about whether hormone replacement therapy is right for you.  Use sunscreen. Apply sunscreen liberally and repeatedly throughout the day. You should seek shade when your shadow is shorter than you. Protect yourself by wearing long sleeves, pants, a wide-brimmed hat, and sunglasses year round, whenever you are outdoors.  Notify your caregiver of new moles or changes in moles, especially if there is a change in shape or color. Also notify your caregiver if a mole is larger than the size of a pencil eraser.  Stay current with your immunizations. Document Released: 05/23/2011 Document Revised: 03/04/2013 Document Reviewed: 05/23/2011 [facility] Patient Information 2014 [place].

## 2014-02-18 NOTE — Progress Notes (Signed)
Melanie Alvarado 01/16/1961 [id number]        53 y.o.  G2P2003 for annual exam.  Former patient of Dr. Eda PaschalGottsegen. Several issues noted below.  Past medical history,surgical history, problem list, medications, allergies, family history and social history were all reviewed and documented in the EPIC chart.  ROS:  Performed and pertinent positives and negatives are included in the history, assessment and plan .  Exam: Kim assistant Filed Vitals:   02/18/14 1042  BP: 120/76  Height: 5\' 7"  (1.702 m)  Weight: 182 lb (82.555 kg)   General appearance  Normal Skin grossly normal Head/Neck normal with no cervical or supraclavicular adenopathy thyroid normal Lungs  clear Cardiac RR, without RMG Abdominal  soft, nontender, without masses, organomegaly or hernia Breasts  examined lying and sitting without masses, retractions, discharge or axillary adenopathy. Pelvic  Ext/BUS/vagina normal  Cervix normal. Pap done  Uterus anteverted, normal size, shape and contour, midline and mobile nontender   Adnexa  Without masses or tenderness    Anus and perineum  Normal   Rectovaginal  Normal sphincter tone without palpated masses or tenderness.    Assessment/Plan:  53 y.o. 202P2003 female for annual exam.   1. Perimenopausal/amenorrhea. Patient has had no bleeding for over a year. History of HerOption endometrial ablation. FSH 28 08/2012. Was having hot flashes but notes that these seem to be better. Will continue to monitor at this time. Patient knows to report any vaginal bleeding. 2. History of cervical dysplasia 2009. Patient is not exactly sure when. Was followed with more frequent Pap smears x3 and then went back to annual screening. No treatment rendered. Last Pap smear 2013. Pap smear done today. 3. Mammography 01/2014. Continue with annual mammography. SBE monthly review. 4. Colonoscopy never. Recommended patient schedule for screening colonoscopy as she has turned 52. Name some numbers  provided. 5. DEXA never. Will plan further into the menopause. Increase calcium and vitamin D. Check vitamin D today. 6. Health maintenance. Baseline CBC comprehensive metabolic panel lipid profile urinalysis TSH vitamin D ordered. All up one year, sooner as needed.   Note: This document was prepared with digital dictation and possible smart phrase technology. Any transcriptional errors that result from this process are unintentional.   Dara LordsFONTAINE,Mekisha Bittel P MD, 11:07 AM 02/18/2014

## 2014-02-18 NOTE — Addendum Note (Signed)
Addended by: Dayna BarkerGARDNER, KIMBERLY K on: 02/18/2014 11:39 AM   Modules accepted: Orders

## 2014-02-19 LAB — URINALYSIS W MICROSCOPIC + REFLEX CULTURE
BILIRUBIN URINE: NEGATIVE
Casts: NONE SEEN
Crystals: NONE SEEN
Glucose, UA: NEGATIVE mg/dL
HGB URINE DIPSTICK: NEGATIVE
Ketones, ur: NEGATIVE mg/dL
Nitrite: NEGATIVE
PROTEIN: NEGATIVE mg/dL
Specific Gravity, Urine: 1.011 (ref 1.005–1.030)
UROBILINOGEN UA: 0.2 mg/dL (ref 0.0–1.0)
pH: 5.5 (ref 5.0–8.0)

## 2014-02-20 LAB — URINE CULTURE
COLONY COUNT: NO GROWTH
Organism ID, Bacteria: NO GROWTH

## 2014-04-18 ENCOUNTER — Encounter (INDEPENDENT_AMBULATORY_CARE_PROVIDER_SITE_OTHER): Payer: Self-pay

## 2014-04-18 ENCOUNTER — Encounter: Payer: Self-pay | Admitting: Family Medicine

## 2014-04-18 ENCOUNTER — Ambulatory Visit (INDEPENDENT_AMBULATORY_CARE_PROVIDER_SITE_OTHER): Payer: BC Managed Care – PPO | Admitting: Family Medicine

## 2014-04-18 VITALS — BP 118/73 | HR 100 | Temp 98.8°F | Ht 67.0 in | Wt 181.4 lb

## 2014-04-18 DIAGNOSIS — J309 Allergic rhinitis, unspecified: Secondary | ICD-10-CM

## 2014-04-18 DIAGNOSIS — J209 Acute bronchitis, unspecified: Secondary | ICD-10-CM

## 2014-04-18 DIAGNOSIS — J302 Other seasonal allergic rhinitis: Secondary | ICD-10-CM

## 2014-04-18 MED ORDER — METHYLPREDNISOLONE ACETATE 80 MG/ML IJ SUSP
80.0000 mg | Freq: Once | INTRAMUSCULAR | Status: AC
  Administered 2014-04-18: 80 mg via INTRAMUSCULAR

## 2014-04-18 MED ORDER — LEVOFLOXACIN 500 MG PO TABS: 500.0000 mg | ORAL_TABLET | Freq: Every day | ORAL | Status: DC

## 2014-04-18 MED ORDER — BENZONATATE 100 MG PO CAPS: 100.0000 mg | ORAL_CAPSULE | Freq: Three times a day (TID) | ORAL | Status: DC | PRN

## 2014-04-18 MED ORDER — MONTELUKAST SODIUM 10 MG PO TABS: 10.0000 mg | ORAL_TABLET | Freq: Every day | ORAL | Status: DC

## 2014-04-18 NOTE — Progress Notes (Signed)
   Subjective:    Patient ID: Melanie Alvarado, female    DOB: 20-Nov-1961, 53 y.o.   MRN: [id number]  HPI This 53 y.o. female presents for evaluation of cough, uri sx's, and allergies.  She has been Having persistent cough for last week.  She states her allergies have been bad over the last year And she has had to come in for URI visits.   Review of Systems No chest pain, SOB, HA, dizziness, vision change, N/V, diarrhea, constipation, dysuria, urinary urgency or frequency, myalgias, arthralgias or rash.     Objective:   Physical Exam Vital signs noted  Well developed well nourished female.  HEENT - Head atraumatic Normocephalic                Eyes - PERRLA, Conjuctiva - clear Sclera- Clear EOMI                Ears - EAC's Wnl TM's Wnl Gross Hearing WNL                Nose - Nares decreased patency                Throat - oropharanx wnl Respiratory - Lungs CTA bilateral Cardiac - RRR S1 and S2 without murmur GI - Abdomen soft Nontender and bowel sounds active x 4 Extremities - No edema. Neuro - Grossly intact.      Assessment & Plan:  Acute bronchitis - Plan: methylPREDNISolone acetate (DEPO-MEDROL) injection 80 mg, benzonatate (TESSALON PERLES) 100 MG capsule, levofloxacin (LEVAQUIN) 500 MG tablet  Seasonal allergies - Plan: montelukast (SINGULAIR) 10 MG tablet  Push po fluids, rest, tylenol and motrin otc prn as directed for fever, arthralgias, and myalgias.  Follow up prn if sx's continue or persist.  Deatra Canter FNP

## 2014-09-01 ENCOUNTER — Other Ambulatory Visit: Payer: Self-pay | Admitting: Family Medicine

## 2014-09-03 NOTE — Telephone Encounter (Signed)
It appears patient is due recheck.  Since I was not provider that she saw for weight loss, I am denying refill and will forward to [facility] to address.  I did call patient to let her know it might be next week before she receives a call.

## 2014-09-03 NOTE — Telephone Encounter (Signed)
Looks like Melanie DusterMichelle originally wrote this rx. Do you fill or should it be sent to a provider? Please advise

## 2014-09-22 ENCOUNTER — Encounter: Payer: Self-pay | Admitting: Family Medicine

## 2014-10-28 ENCOUNTER — Telehealth: Payer: Self-pay | Admitting: *Deleted

## 2014-10-28 MED ORDER — FLUCONAZOLE 150 MG PO TABS: 150.0000 mg | ORAL_TABLET | Freq: Once | ORAL | Status: DC

## 2014-10-28 NOTE — Telephone Encounter (Signed)
Patient needs refill on Diflucan.  Ok'd per [facility], #2 tablets with 1 refills.  Prescription sent, patient notified.

## 2014-12-04 ENCOUNTER — Other Ambulatory Visit: Payer: Self-pay

## 2014-12-04 DIAGNOSIS — I8390 Asymptomatic varicose veins of unspecified lower extremity: Secondary | ICD-10-CM

## 2015-01-09 ENCOUNTER — Other Ambulatory Visit: Payer: Self-pay

## 2015-01-09 DIAGNOSIS — Z1231 Encounter for screening mammogram for malignant neoplasm of breast: Secondary | ICD-10-CM

## 2015-02-02 ENCOUNTER — Encounter (HOSPITAL_COMMUNITY): Payer: Self-pay

## 2015-02-02 ENCOUNTER — Encounter: Payer: Self-pay | Admitting: Surgery

## 2015-02-10 ENCOUNTER — Encounter: Payer: Self-pay | Admitting: Surgery

## 2015-02-11 ENCOUNTER — Ambulatory Visit (HOSPITAL_COMMUNITY)
Admission: RE | Admit: 2015-02-11 | Discharge: 2015-02-11 | Disposition: A | Discharge status: Home / Self Care | Payer: BLUE CROSS/BLUE SHIELD | Source: Ambulatory Visit | Attending: Surgery | Admitting: Surgery

## 2015-02-11 ENCOUNTER — Ambulatory Visit (INDEPENDENT_AMBULATORY_CARE_PROVIDER_SITE_OTHER): Payer: BLUE CROSS/BLUE SHIELD | Admitting: Surgery

## 2015-02-11 ENCOUNTER — Encounter: Payer: Self-pay | Admitting: Surgery

## 2015-02-11 VITALS — BP 129/81 | HR 73 | Resp 14 | Ht 67.0 in | Wt 175.0 lb

## 2015-02-11 DIAGNOSIS — I868 Varicose veins of other specified sites: Secondary | ICD-10-CM | POA: Diagnosis not present

## 2015-02-11 DIAGNOSIS — I8390 Asymptomatic varicose veins of unspecified lower extremity: Secondary | ICD-10-CM

## 2015-02-11 DIAGNOSIS — I872 Venous insufficiency (chronic) (peripheral): Secondary | ICD-10-CM | POA: Insufficient documentation

## 2015-02-11 NOTE — Addendum Note (Signed)
Addended by: Sharee PimpleMCCHESNEY, Sharada Albornoz K on: 02/11/2015 02:21 PM   Modules accepted: Orders

## 2015-02-11 NOTE — Progress Notes (Signed)
Patient name: Melanie SonsCarla H Detlefsen MRN: [id number] DOB: 11/23/1960 Sex: female   Referred by: Dr. SwazilandJordan  Reason for referral:  Chief Complaint  Patient presents with  . New Evaluation    bilateral varicose veins left leg worse    HISTORY OF PRESENT ILLNESS: This is a very pleasant 54 year old female who is referred today for evaluation of varicose veins.  The patient states that for the past 6-8 month she has been having worsening pain and swelling in her legs.  The left leg bothers her more than the right.  She also feels that her veins are becoming more prominent.  She denies having any bleeding episodes.  She denies a history of DVT.  She states that her symptoms are worse at the end of the day and are alleviated with rest.  Past Medical History  Diagnosis Date  . Anxiety   . Depression   . Varicose veins   . Allergy     Past Surgical History  Procedure Laterality Date  . Pelvic laparoscopy  1996    LASER OF ENDOMETRIOSIS,DRAIN LEFT OV CYST  . Excision of basal cell  2011  . Endometrial ablation      HER OPTION  . Colposcopy  2009 approximately    Cervical dysplasia    History   Social History  . Marital Status: Married    Spouse Name: N/A  . Number of Children: N/A  . Years of Education: N/A   Occupational History  . Not on file.   Social History Main Topics  . Smoking status: Former Smoker    Quit date: 08/22/1993  . Smokeless tobacco: Never Used  . Alcohol Use: 0.0 oz/week    0 Standard drinks or equivalent per week     Comment: Rare  . Drug Use: No  . Sexual Activity: Yes    Birth Control/ Protection: Post-menopausal   Other Topics Concern  . Not on file   Social History Narrative    Family History  Problem Relation Age of Onset  . Hypertension Paternal Grandmother   . Cancer Paternal Grandmother     BONE, LUNG CANCER  . Stroke Father   . Hyperlipidemia Father   . Hypertension Father   . Pancreatic cancer Maternal Grandmother   . Heart  disease Mother   . Hyperlipidemia Mother   . Varicose Veins Mother     Allergies as of 02/11/2015 - Review Complete 02/11/2015  Allergen Reaction Noted  . Erythromycin  06/03/2011    Current Outpatient Prescriptions on File Prior to Visit  Medication Sig Dispense Refill  . triamcinolone (NASACORT) 55 MCG/ACT nasal inhaler Place 2 sprays into the nose daily.    . valACYclovir (VALTREX) 1000 MG tablet Take 2 tablets at onset then in 12 hours 30 tablet 0  . benzonatate (TESSALON PERLES) 100 MG capsule Take 1 capsule (100 mg total) by mouth 3 (three) times daily as needed for cough. (Patient not taking: Reported on 02/11/2015) 30 capsule 1  . fluconazole (DIFLUCAN) 150 MG tablet Take 1 tablet (150 mg total) by mouth once. (Patient not taking: Reported on 02/11/2015) 2 tablet 1  . levofloxacin (LEVAQUIN) 500 MG tablet Take 1 tablet (500 mg total) by mouth daily. (Patient not taking: Reported on 02/11/2015) 7 tablet 0  . montelukast (SINGULAIR) 10 MG tablet Take 1 tablet (10 mg total) by mouth at bedtime. (Patient not taking: Reported on 02/11/2015) 30 tablet 3  . phentermine 37.5 MG capsule Take 1 capsule (37.5 mg  total) by mouth every morning. (Patient not taking: Reported on 02/11/2015) 30 capsule 2   Current Facility-Administered Medications on File Prior to Visit  Medication Dose Route Frequency Provider Last Rate Last Dose  . triamcinolone acetonide (KENALOG-40) injection 60 mg  60 mg Intramuscular Once Deatra Canter, FNP         REVIEW OF SYSTEMS: Cardiovascular: No chest pain, chest pressure, palpitations, orthopnea, or dyspnea on exertion. No claudication or rest pain,  positive for varicose veins. Pulmonary: No productive cough, asthma or wheezing. Neurologic: No weakness, paresthesias, aphasia, or amaurosis. No dizziness. Hematologic: No bleeding problems or clotting disorders. Musculoskeletal: No joint pain or joint swelling. Gastrointestinal: No blood in stool or  hematemesis Genitourinary: No dysuria or hematuria. Psychiatric:: No history of major depression. Integumentary: No rashes or ulcers. Constitutional: No fever or chills.  PHYSICAL EXAMINATION:  Filed Vitals:   02/11/15 0953  BP: 129/81  Pulse: 73  Resp: 14  Height:  (1.702 m)  Weight: 175 lb (79.379 kg)   Body mass index is 27.4 kg/(m^2). General: The patient appears their stated age.   HEENT:  No gross abnormalities Pulmonary: Respirations are non-labored Musculoskeletal: There are no major deformities.   Neurologic: No focal weakness or paresthesias are detected, Skin: There are no ulcer or rashes noted. Psychiatric: The patient has normal affect. Cardiovascular: Palpable pedal pulses.  1+ pitting edema bilaterally.  Varicosities on the left extend on the lateral side from the upper thigh down to the lower leg.  On the right varicosities are visualized on the right lateral thigh.  Diagnostic Studies: Venous reflux evaluation was performed today.  The patient has deep vein reflux bilaterally.  There is a short section of superficial vein reflux in the great saphenous vein on the right.  The great saphenous vein on the left shows reflux within maximum diameter of 0.53 cm at the saphenofemoral junction    Assessment:  Chronic venous insufficiency, left greater than right Plan: I discussed with the patient that her venous insufficiency should not limit her daily activity.  I recommended that she try 20-30 thigh-high compression stockings to see if she gets benefit from this.  Based on the size of her great saphenous vein, I do not think that she is a candidate for endovenous laser ablation at this time.  I have recommended that she come back in 1 year if her symptoms have gotten worse and have her venous reflux exam repeated.     Jorge Ny, M.D. [facility] Office: [phone] Pager:  [phone]

## 2015-03-04 ENCOUNTER — Ambulatory Visit (INDEPENDENT_AMBULATORY_CARE_PROVIDER_SITE_OTHER): Payer: BLUE CROSS/BLUE SHIELD | Admitting: Gynecology

## 2015-03-04 ENCOUNTER — Encounter: Payer: Self-pay | Admitting: Gynecology

## 2015-03-04 ENCOUNTER — Ambulatory Visit
Admission: RE | Admit: 2015-03-04 | Discharge: 2015-03-04 | Disposition: A | Discharge status: Home / Self Care | Payer: BLUE CROSS/BLUE SHIELD | Source: Ambulatory Visit

## 2015-03-04 VITALS — BP 116/74 | Ht 68.0 in | Wt 182.0 lb

## 2015-03-04 DIAGNOSIS — Z01419 Encounter for gynecological examination (general) (routine) without abnormal findings: Secondary | ICD-10-CM

## 2015-03-04 DIAGNOSIS — N951 Menopausal and female climacteric states: Secondary | ICD-10-CM

## 2015-03-04 DIAGNOSIS — Z1231 Encounter for screening mammogram for malignant neoplasm of breast: Secondary | ICD-10-CM

## 2015-03-04 LAB — CBC WITH DIFFERENTIAL/PLATELET
BASOS ABS: 0.1 10*3/uL (ref 0.0–0.1)
Basophils Relative: 1 % (ref 0–1)
EOS ABS: 0.1 10*3/uL (ref 0.0–0.7)
EOS PCT: 2 % (ref 0–5)
HCT: 40 % (ref 36.0–46.0)
Hemoglobin: 13.8 g/dL (ref 12.0–15.0)
Lymphocytes Relative: 39 % (ref 12–46)
Lymphs Abs: 2.2 10*3/uL (ref 0.7–4.0)
MCH: 32.2 pg (ref 26.0–34.0)
MCHC: 34.5 g/dL (ref 30.0–36.0)
MCV: 93.2 fL (ref 78.0–100.0)
MPV: 9.9 fL (ref 8.6–12.4)
Monocytes Absolute: 0.5 10*3/uL (ref 0.1–1.0)
Monocytes Relative: 9 % (ref 3–12)
NEUTROS PCT: 49 % (ref 43–77)
Neutro Abs: 2.7 10*3/uL (ref 1.7–7.7)
PLATELETS: 240 10*3/uL (ref 150–400)
RBC: 4.29 MIL/uL (ref 3.87–5.11)
RDW: 13.5 % (ref 11.5–15.5)
WBC: 5.6 10*3/uL (ref 4.0–10.5)

## 2015-03-04 LAB — COMPREHENSIVE METABOLIC PANEL
ALBUMIN: 4 g/dL (ref 3.5–5.2)
ALK PHOS: 78 U/L (ref 39–117)
ALT: 22 U/L (ref 0–35)
AST: 26 U/L (ref 0–37)
BUN: 12 mg/dL (ref 6–23)
CHLORIDE: 105 meq/L (ref 96–112)
CO2: 25 mEq/L (ref 19–32)
Calcium: 8.9 mg/dL (ref 8.4–10.5)
Creat: 0.58 mg/dL (ref 0.50–1.10)
Glucose, Bld: 77 mg/dL (ref 70–99)
Potassium: 4.2 mEq/L (ref 3.5–5.3)
SODIUM: 138 meq/L (ref 135–145)
TOTAL PROTEIN: 6.5 g/dL (ref 6.0–8.3)
Total Bilirubin: 0.8 mg/dL (ref 0.2–1.2)

## 2015-03-04 LAB — LIPID PANEL
CHOLESTEROL: 179 mg/dL (ref 0–200)
HDL: 71 mg/dL (ref >=46)
LDL CALC: 99 mg/dL (ref 0–99)
Total CHOL/HDL Ratio: 2.5 Ratio
Triglycerides: 44 mg/dL (ref <150)
VLDL: 9 mg/dL (ref 0–40)

## 2015-03-04 LAB — TSH: TSH: 1.324 u[IU]/mL (ref 0.350–4.500)

## 2015-03-04 NOTE — Progress Notes (Signed)
Erin SonsCarla H Chea 08/18/1961 [id number]        54 y.o.  G2P2003 for annual exam.  Overall doing well.  Past medical history,surgical history, problem list, medications, allergies, family history and social history were all reviewed and documented as reviewed in the EPIC chart.  ROS:  Performed with pertinent positives and negatives included in the history, assessment and plan.   Additional significant findings :  none   Exam: [facility] personassistant Filed Vitals:   03/04/15 0900  BP: 116/74  Height: 5\' 8"  (1.727 m)  Weight: 182 lb (82.555 kg)   General appearance:  Normal affect, orientation and appearance. Skin: Grossly normal HEENT: Without gross lesions.  No cervical or supraclavicular adenopathy. Thyroid normal.  Lungs:  Clear without wheezing, rales or rhonchi Cardiac: RR, without RMG Abdominal:  Soft, nontender, without masses, guarding, rebound, organomegaly or hernia Breasts:  Examined lying and sitting without masses, retractions, discharge or axillary adenopathy. Pelvic:  Ext/BUS/vagina normal  Cervix normal  Uterus  anteverted, normal size, shape and contour, midline and mobile nontender   Adnexa  Without masses or tenderness    Anus and perineum  Normal   Rectovaginal  Normal sphincter tone without palpated masses or tenderness.    Assessment/Plan:  54 y.o. 822P2003 female for annual exam.   1. Postmenopausal. Is having some hot flushes. Seems to be getting better over time. Reviewed options to include OTC products as well as HRT. Issues of HRT to include the WHI study with increased risk of stroke heart attack DVT breast cancer reviewed. Patient not interested in prescription medication at this point. Will try OTC products first. No vaginal bleeding or vaginal dryness. Report any vaginal bleeding. 2. Mammography today. Continue with annual mammography. SBE monthly reviewed. 3. Pap smear-negative 2015. No Pap smear done today.  History of dysplasia 2009 with negative Pap smears  since then. Plan repeat Pap smear at 3 year interval. 4. Colonoscopy never. I again recommended scheduling a screening colonoscopy and names and numbers provided. 5. DEXA never. Plan further into the menopause. Increased calcium vitamin D reviewed.  Check vitamin D level today. 6. Health maintenance. Check baseline CBC, comprehensive metabolic panel, lipid profile, TSH, vitamin D and urinalysis. Follow up in one year, sooner as needed.     Dara LordsFONTAINE,Galena Logie P MD, 9:22 AM 03/04/2015

## 2015-03-04 NOTE — Patient Instructions (Signed)
Schedule colonoscopy with [facility] at [phone] or [facility] gastroenterology at [phone]  You may obtain a copy of any labs that were done today by logging onto MyChart as outlined in the instructions provided with your AVS (after visit summary). The office will not call with normal lab results but certainly if there are any significant abnormalities then we will contact you.   Health Maintenance, Female A healthy lifestyle and preventative care can promote health and wellness.  Maintain regular health, dental, and eye exams.  Eat a healthy diet. Foods like vegetables, fruits, whole grains, low-fat dairy products, and lean protein foods contain the nutrients you need without too many calories. Decrease your intake of foods high in solid fats, added sugars, and salt. Get information about a proper diet from your caregiver, if necessary.  Regular physical exercise is one of the most important things you can do for your health. Most adults should get at least 150 minutes of moderate-intensity exercise (any activity that increases your heart rate and causes you to sweat) each week. In addition, most adults need muscle-strengthening exercises on 2 or more days a week.   Maintain a healthy weight. The body mass index (BMI) is a screening tool to identify possible weight problems. It provides an estimate of body fat based on height and weight. Your caregiver can help determine your BMI, and can help you achieve or maintain a healthy weight. For adults 20 years and older:  A BMI below 18.5 is considered underweight.  A BMI of 18.5 to 24.9 is normal.  A BMI of 25 to 29.9 is considered overweight.  A BMI of 30 and above is considered obese.  Maintain normal blood lipids and cholesterol by exercising and minimizing your intake of saturated fat. Eat a balanced diet with plenty of fruits and vegetables. Blood tests for lipids and cholesterol should begin at age 44 and be repeated every  5 years. If your lipid or cholesterol levels are high, you are over 50, or you are a high risk for heart disease, you may need your cholesterol levels checked more frequently.Ongoing high lipid and cholesterol levels should be treated with medicines if diet and exercise are not effective.  If you smoke, find out from your caregiver how to quit. If you do not use tobacco, do not start.  Lung cancer screening is recommended for adults aged 49 80 years who are at high risk for developing lung cancer because of a history of smoking. Yearly low-dose computed tomography (CT) is recommended for people who have at least a 30-pack-year history of smoking and are a current smoker or have quit within the past 15 years. A pack year of smoking is smoking an average of 1 pack of cigarettes a day for 1 year (for example: 1 pack a day for 30 years or 2 packs a day for 15 years). Yearly screening should continue until the smoker has stopped smoking for at least 15 years. Yearly screening should also be stopped for people who develop a health problem that would prevent them from having lung cancer treatment.  If you are pregnant, do not drink alcohol. If you are breastfeeding, be very cautious about drinking alcohol. If you are not pregnant and choose to drink alcohol, do not exceed 1 drink per day. One drink is considered to be 12 ounces (355 mL) of beer, 5 ounces (148 mL) of wine, or 1.5 ounces (44 mL) of liquor.  Avoid use of street drugs. Do not share needles  with anyone. Ask for help if you need support or instructions about stopping the use of drugs.  High blood pressure causes heart disease and increases the risk of stroke. Blood pressure should be checked at least every 1 to 2 years. Ongoing high blood pressure should be treated with medicines, if weight loss and exercise are not effective.  If you are 55 to 52 years old, ask your caregiver if you should take aspirin to prevent strokes.  Diabetes screening  involves taking a blood sample to check your fasting blood sugar level. This should be done once every 3 years, after age 45, if you are within normal weight and without risk factors for diabetes. Testing should be considered at a younger age or be carried out more frequently if you are overweight and have at least 1 risk factor for diabetes.  Breast cancer screening is essential preventative care for women. You should practice "breast self-awareness." This means understanding the normal appearance and feel of your breasts and may include breast self-examination. Any changes detected, no matter how small, should be reported to a caregiver. Women in their 20s and 30s should have a clinical breast exam (CBE) by a caregiver as part of a regular health exam every 1 to 3 years. After age 40, women should have a CBE every year. Starting at age 40, women should consider having a mammogram (breast X-ray) every year. Women who have a family history of breast cancer should talk to their caregiver about genetic screening. Women at a high risk of breast cancer should talk to their caregiver about having an MRI and a mammogram every year.  Breast cancer gene (BRCA)-related cancer risk assessment is recommended for women who have family members with BRCA-related cancers. BRCA-related cancers include breast, ovarian, tubal, and peritoneal cancers. Having family members with these cancers may be associated with an increased risk for harmful changes (mutations) in the breast cancer genes BRCA1 and BRCA2. Results of the assessment will determine the need for genetic counseling and BRCA1 and BRCA2 testing.  The Pap test is a screening test for cervical cancer. Women should have a Pap test starting at age 21. Between ages 21 and 29, Pap tests should be repeated every 2 years. Beginning at age 30, you should have a Pap test every 3 years as long as the past 3 Pap tests have been normal. If you had a hysterectomy for a problem that  was not cancer or a condition that could lead to cancer, then you no longer need Pap tests. If you are between ages 65 and 70, and you have had normal Pap tests going back 10 years, you no longer need Pap tests. If you have had past treatment for cervical cancer or a condition that could lead to cancer, you need Pap tests and screening for cancer for at least 20 years after your treatment. If Pap tests have been discontinued, risk factors (such as a new sexual partner) need to be reassessed to determine if screening should be resumed. Some women have medical problems that increase the chance of getting cervical cancer. In these cases, your caregiver may recommend more frequent screening and Pap tests.  The human papillomavirus (HPV) test is an additional test that may be used for cervical cancer screening. The HPV test looks for the virus that can cause the cell changes on the cervix. The cells collected during the Pap test can be tested for HPV. The HPV test could be used to screen women aged   30 years and older, and should be used in women of any age who have unclear Pap test results. After the age of 85, women should have HPV testing at the same frequency as a Pap test.  Colorectal cancer can be detected and often prevented. Most routine colorectal cancer screening begins at the age of 61 and continues through age 56. However, your caregiver may recommend screening at an earlier age if you have risk factors for colon cancer. On a yearly basis, your caregiver may provide home test kits to check for hidden blood in the stool. Use of a small camera at the end of a tube, to directly examine the colon (sigmoidoscopy or colonoscopy), can detect the earliest forms of colorectal cancer. Talk to your caregiver about this at age 83, when routine screening begins. Direct examination of the colon should be repeated every 5 to 10 years through age 72, unless early forms of pre-cancerous polyps or small growths are  found.  Hepatitis C blood testing is recommended for all people born from [birth year] through 1963 and any individual with known risks for hepatitis C.  Practice safe sex. Use condoms and avoid high-risk sexual practices to reduce the spread of sexually transmitted infections (STIs). Sexually active women aged 69 and younger should be checked for Chlamydia, which is a common sexually transmitted infection. Older women with new or multiple partners should also be tested for Chlamydia. Testing for other STIs is recommended if you are sexually active and at increased risk.  Osteoporosis is a disease in which the bones lose minerals and strength with aging. This can result in serious bone fractures. The risk of osteoporosis can be identified using a bone density scan. Women ages 81 and over and women at risk for fractures or osteoporosis should discuss screening with their caregivers. Ask your caregiver whether you should be taking a calcium supplement or vitamin D to reduce the rate of osteoporosis.  Menopause can be associated with physical symptoms and risks. Hormone replacement therapy is available to decrease symptoms and risks. You should talk to your caregiver about whether hormone replacement therapy is right for you.  Use sunscreen. Apply sunscreen liberally and repeatedly throughout the day. You should seek shade when your shadow is shorter than you. Protect yourself by wearing long sleeves, pants, a wide-brimmed hat, and sunglasses year round, whenever you are outdoors.  Notify your caregiver of new moles or changes in moles, especially if there is a change in shape or color. Also notify your caregiver if a mole is larger than the size of a pencil eraser.  Stay current with your immunizations. Document Released: 05/23/2011 Document Revised: 03/04/2013 Document Reviewed: 05/23/2011 [facility] Patient Information 2014 [place].

## 2015-03-05 LAB — URINALYSIS W MICROSCOPIC + REFLEX CULTURE
Bacteria, UA: NONE SEEN
Bilirubin Urine: NEGATIVE
Casts: NONE SEEN
Crystals: NONE SEEN
GLUCOSE, UA: NEGATIVE mg/dL
HGB URINE DIPSTICK: NEGATIVE
Ketones, ur: NEGATIVE mg/dL
NITRITE: NEGATIVE
Protein, ur: NEGATIVE mg/dL
SPECIFIC GRAVITY, URINE: 1.008 (ref 1.005–1.030)
Urobilinogen, UA: 0.2 mg/dL (ref 0.0–1.0)
pH: 6.5 (ref 5.0–8.0)

## 2015-03-05 LAB — VITAMIN D 25 HYDROXY (VIT D DEFICIENCY, FRACTURES): Vit D, 25-Hydroxy: 25 ng/mL — ABNORMAL LOW (ref 30–100)

## 2015-03-07 LAB — URINE CULTURE

## 2015-04-02 ENCOUNTER — Ambulatory Visit: Payer: BLUE CROSS/BLUE SHIELD | Admitting: Nurse Practitioner

## 2015-08-15 ENCOUNTER — Ambulatory Visit (INDEPENDENT_AMBULATORY_CARE_PROVIDER_SITE_OTHER): Payer: BLUE CROSS/BLUE SHIELD

## 2015-08-15 DIAGNOSIS — Z23 Encounter for immunization: Secondary | ICD-10-CM | POA: Diagnosis not present

## 2016-02-08 ENCOUNTER — Encounter: Payer: Self-pay | Admitting: Surgery

## 2016-02-12 ENCOUNTER — Other Ambulatory Visit: Payer: Self-pay

## 2016-02-12 DIAGNOSIS — Z1231 Encounter for screening mammogram for malignant neoplasm of breast: Secondary | ICD-10-CM

## 2016-02-15 ENCOUNTER — Encounter (HOSPITAL_COMMUNITY): Payer: BLUE CROSS/BLUE SHIELD

## 2016-02-15 ENCOUNTER — Ambulatory Visit: Payer: BLUE CROSS/BLUE SHIELD | Admitting: Surgery

## 2016-03-24 ENCOUNTER — Ambulatory Visit
Admission: RE | Admit: 2016-03-24 | Discharge: 2016-03-24 | Disposition: A | Discharge status: Home / Self Care | Payer: No Typology Code available for payment source | Source: Ambulatory Visit

## 2016-03-24 DIAGNOSIS — Z1231 Encounter for screening mammogram for malignant neoplasm of breast: Secondary | ICD-10-CM

## 2016-03-28 ENCOUNTER — Ambulatory Visit: Payer: Self-pay

## 2016-03-28 ENCOUNTER — Encounter: Payer: BLUE CROSS/BLUE SHIELD | Admitting: Gynecology

## 2016-04-05 ENCOUNTER — Encounter: Payer: Self-pay | Admitting: Surgery

## 2016-04-11 ENCOUNTER — Ambulatory Visit (HOSPITAL_COMMUNITY): Payer: No Typology Code available for payment source

## 2016-04-11 ENCOUNTER — Ambulatory Visit: Payer: No Typology Code available for payment source | Admitting: Surgery

## 2016-05-12 ENCOUNTER — Encounter: Payer: Self-pay | Admitting: Gynecology

## 2016-05-12 ENCOUNTER — Ambulatory Visit (INDEPENDENT_AMBULATORY_CARE_PROVIDER_SITE_OTHER): Payer: No Typology Code available for payment source | Admitting: Gynecology

## 2016-05-12 VITALS — BP 116/74 | Ht 67.0 in | Wt 171.0 lb

## 2016-05-12 DIAGNOSIS — N952 Postmenopausal atrophic vaginitis: Secondary | ICD-10-CM

## 2016-05-12 DIAGNOSIS — Z1321 Encounter for screening for nutritional disorder: Secondary | ICD-10-CM | POA: Diagnosis not present

## 2016-05-12 DIAGNOSIS — Z01419 Encounter for gynecological examination (general) (routine) without abnormal findings: Secondary | ICD-10-CM | POA: Diagnosis not present

## 2016-05-12 DIAGNOSIS — N951 Menopausal and female climacteric states: Secondary | ICD-10-CM

## 2016-05-12 DIAGNOSIS — Z1329 Encounter for screening for other suspected endocrine disorder: Secondary | ICD-10-CM

## 2016-05-12 LAB — COMPREHENSIVE METABOLIC PANEL
ALBUMIN: 3.9 g/dL (ref 3.6–5.1)
ALT: 11 U/L (ref 6–29)
AST: 16 U/L (ref 10–35)
Alkaline Phosphatase: 78 U/L (ref 33–130)
BUN: 10 mg/dL (ref 7–25)
CALCIUM: 9 mg/dL (ref 8.6–10.4)
CO2: 25 mmol/L (ref 20–31)
Chloride: 104 mmol/L (ref 98–110)
Creat: 0.78 mg/dL (ref 0.50–1.05)
Glucose, Bld: 85 mg/dL (ref 65–99)
POTASSIUM: 3.6 mmol/L (ref 3.5–5.3)
Sodium: 139 mmol/L (ref 135–146)
Total Bilirubin: 0.7 mg/dL (ref 0.2–1.2)
Total Protein: 6.5 g/dL (ref 6.1–8.1)

## 2016-05-12 LAB — CBC WITH DIFFERENTIAL/PLATELET
BASOS ABS: 70 {cells}/uL (ref 0–200)
Basophils Relative: 1 %
EOS ABS: 70 {cells}/uL (ref 15–500)
Eosinophils Relative: 1 %
HCT: 38.1 % (ref 35.0–45.0)
HEMOGLOBIN: 12.8 g/dL (ref 11.7–15.5)
LYMPHS ABS: 2590 {cells}/uL (ref 850–3900)
LYMPHS PCT: 37 %
MCH: 31.3 pg (ref 27.0–33.0)
MCHC: 33.6 g/dL (ref 32.0–36.0)
MCV: 93.2 fL (ref 80.0–100.0)
MONO ABS: 560 {cells}/uL (ref 200–950)
MPV: 10.1 fL (ref 7.5–12.5)
Monocytes Relative: 8 %
NEUTROS PCT: 53 %
Neutro Abs: 3710 cells/uL (ref 1500–7800)
Platelets: 223 10*3/uL (ref 140–400)
RBC: 4.09 MIL/uL (ref 3.80–5.10)
RDW: 13.1 % (ref 11.0–15.0)
WBC: 7 10*3/uL (ref 3.8–10.8)

## 2016-05-12 LAB — TSH: TSH: 1.09 m[IU]/L

## 2016-05-12 NOTE — Progress Notes (Signed)
    Melanie SonsCarla H Alvarado 10/24/1961 [id number]        55 y.o.  G2P2003  for annual exam.  Several issues noted below.  Past medical history,surgical history, problem list, medications, allergies, family history and social history were all reviewed and documented as reviewed in the EPIC chart.  ROS:  Performed with pertinent positives and negatives included in the history, assessment and plan.   Additional significant findings :  None   Exam: Melanie PortelaKim Alvarado assistant Filed Vitals:   05/12/16 1421  BP: 116/74  Height: 5\' 7"  (1.702 m)  Weight: 171 lb (77.565 kg)   General appearance:  Normal affect, orientation and appearance. Skin: Grossly normal HEENT: Without gross lesions.  No cervical or supraclavicular adenopathy. Thyroid normal.  Lungs:  Clear without wheezing, rales or rhonchi Cardiac: RR, without RMG Abdominal:  Soft, nontender, without masses, guarding, rebound, organomegaly or hernia Breasts:  Examined lying and sitting without masses, retractions, discharge or axillary adenopathy. Pelvic:  Ext/BUS/vagina with mild atrophic changes  Cervix with atrophic changes  Uterus anteverted, normal size, shape and contour, midline and mobile nontender   Adnexa without masses or tenderness    Anus and perineum normal   Rectovaginal normal sphincter tone without palpated masses or tenderness.    Assessment/Plan:  55 y.o. 572P2003 female for annual exam.   1. Postmenopausal/atrophic genital changes. Patient is having some hot flushes although not consistently. No significant vaginal dryness or night sweats. No vaginal bleeding. Recommended trial of OTC soy based products such as Estrovin. She does not feel that is bad enough to warrant HRT at this time. She'll follow up if she wants to rediscuss treatment options. Patient knows to report any vaginal bleeding. 2. Pap smear 01/2014. No Pap smear done today. History of dysplasia 2009 with negative Pap smears since. Plan Pap smear next year at 3  year interval per current screening guidelines. 3. Mammography 03/2016. Continue with annual mammography when due. SBE monthly reviewed. 4. Colonoscopy never. I again recommended she schedule screening colonoscopy. Second-most common cancer in women reviewed. Benefits of precancerous polyp removal discussed. Patient agrees to schedule. 5. Health maintenance. Patient requests baseline labs. CBC, CMP, vitamin D, TSH and urinalysis. Lipid profile normal last year and not repeated. Follow up in one year, sooner as needed.   Dara LordsFONTAINE,Melanie Alvarado P MD, 2:47 PM 05/12/2016

## 2016-05-12 NOTE — Patient Instructions (Signed)

## 2016-05-13 LAB — URINALYSIS W MICROSCOPIC + REFLEX CULTURE
BILIRUBIN URINE: NEGATIVE
Bacteria, UA: NONE SEEN [HPF]
Casts: NONE SEEN [LPF]
Crystals: NONE SEEN [HPF]
Glucose, UA: NEGATIVE
Hgb urine dipstick: NEGATIVE
NITRITE: NEGATIVE
PH: 5.5 (ref 5.0–8.0)
Protein, ur: NEGATIVE
Specific Gravity, Urine: 1.025 (ref 1.001–1.035)
YEAST: NONE SEEN [HPF]

## 2016-05-13 LAB — VITAMIN D 25 HYDROXY (VIT D DEFICIENCY, FRACTURES): VIT D 25 HYDROXY: 33 ng/mL (ref 30–100)

## 2016-05-14 LAB — URINE CULTURE
COLONY COUNT: NO GROWTH
ORGANISM ID, BACTERIA: NO GROWTH

## 2016-06-27 ENCOUNTER — Other Ambulatory Visit: Payer: Self-pay | Admitting: Family

## 2016-06-27 ENCOUNTER — Encounter (INDEPENDENT_AMBULATORY_CARE_PROVIDER_SITE_OTHER): Payer: Self-pay

## 2016-06-27 ENCOUNTER — Encounter: Payer: Self-pay | Admitting: Family

## 2016-06-27 ENCOUNTER — Ambulatory Visit (INDEPENDENT_AMBULATORY_CARE_PROVIDER_SITE_OTHER): Payer: No Typology Code available for payment source | Admitting: Family

## 2016-06-27 VITALS — BP 108/76 | HR 86 | Temp 97.5°F | Ht 67.0 in | Wt 176.8 lb

## 2016-06-27 DIAGNOSIS — S29012A Strain of muscle and tendon of back wall of thorax, initial encounter: Secondary | ICD-10-CM | POA: Diagnosis not present

## 2016-06-27 MED ORDER — CYCLOBENZAPRINE HCL 10 MG PO TABS: 10.0000 mg | ORAL_TABLET | Freq: Three times a day (TID) | ORAL | 0 refills | Status: DC | PRN

## 2016-06-27 MED ORDER — PREDNISONE 10 MG (21) PO TBPK: ORAL_TABLET | ORAL | 0 refills | Status: DC

## 2016-06-27 MED ORDER — NAPROXEN 500 MG PO TABS: 500.0000 mg | ORAL_TABLET | Freq: Two times a day (BID) | ORAL | 1 refills | Status: DC

## 2016-06-27 NOTE — Progress Notes (Signed)
   Subjective:    Patient ID: Erin SonsCarla H Wever, female    DOB: 07/22/1961, 55 y.o.   MRN: [id number]  Back Pain  This is a new problem. The current episode started 1 to 4 weeks ago. The problem occurs constantly. The problem is unchanged. The pain is present in the thoracic spine. The quality of the pain is described as aching. The pain is at a severity of 10/10. The pain is moderate. The symptoms are aggravated by position. Associated symptoms include weakness. Pertinent negatives include no bladder incontinence, bowel incontinence, dysuria, fever, headaches, leg pain or numbness. Risk factors include obesity. She has tried NSAIDs and muscle relaxant for the symptoms. The treatment provided mild relief.      Review of Systems  Constitutional: Negative for fever.  HENT: Negative.   Eyes: Negative.   Respiratory: Negative.  Negative for shortness of breath.   Cardiovascular: Negative.  Negative for palpitations.  Gastrointestinal: Negative.  Negative for bowel incontinence.  Endocrine: Negative.   Genitourinary: Negative.  Negative for bladder incontinence and dysuria.  Musculoskeletal: Positive for back pain.  Neurological: Positive for weakness. Negative for numbness and headaches.  Hematological: Negative.   Psychiatric/Behavioral: Negative.   All other systems reviewed and are negative.      Objective:   Physical Exam  Constitutional: She is oriented to person, place, and time. She appears well-developed and well-nourished. No distress.  HENT:  Head: Normocephalic.  Cardiovascular: Normal rate, regular rhythm, normal heart sounds and intact distal pulses.   No murmur heard. Pulmonary/Chest: Effort normal and breath sounds normal. No respiratory distress. She has no wheezes.  Abdominal: Soft. Bowel sounds are normal. She exhibits no distension. There is no tenderness.  Musculoskeletal: Normal range of motion. She exhibits tenderness. She exhibits no edema.  Mild tenderness present  on right thoracic spine    Neurological: She is alert and oriented to person, place, and time.  Skin: Skin is warm and dry.  Psychiatric: She has a normal mood and affect. Her behavior is normal. Judgment and thought content normal.  Vitals reviewed.   BP 108/76   Pulse 86   Temp 97.5 F (36.4 C) (Oral)   Ht 5\' 7"  (1.702 m)   Wt 176 lb 12.8 oz (80.2 kg)   LMP 01/19/2013   BMI 27.69 kg/m        Assessment & Plan:  1. Muscle strain of right upper back, initial encounter -Rest -Ice and heat as needed -ROM stretches -Take naprosyn with food BID for next 5-7 days -RTO prn  - naproxen (NAPROSYN) 500 MG tablet; Take 1 tablet (500 mg total) by mouth 2 (two) times daily with a meal.  Dispense: 60 tablet; Refill: 1 - cyclobenzaprine (FLEXERIL) 10 MG tablet; Take 1 tablet (10 mg total) by mouth 3 (three) times daily as needed for muscle spasms.  Dispense: 30 tablet; Refill: 0 - predniSONE (STERAPRED UNI-PAK 21 TAB) 10 MG (21) TBPK tablet; Use as directed  Dispense: 21 tablet; Refill: 0  Jannifer Rodneyhristy Demetrias Goodbar, FNP

## 2016-06-27 NOTE — Patient Instructions (Signed)
Thoracic Strain A thoracic strain, which is sometimes called a mid-back strain, is an injury to the muscles or tendons that attach to the upper part of your back behind your chest. This type of injury occurs when a muscle is overstretched or overloaded.  Thoracic strains can range from mild to severe. Mild strains may involve stretching a muscle or tendon without tearing it. These injuries may heal in 1-2 weeks. More severe strains involve tearing of muscle fibers or tendons. These will cause more pain and may take 6-8 weeks to heal. CAUSES This condition may be caused by:  An injury in which a sudden force is placed on the muscle.  Exercising without properly warming up.  Overuse of the muscle.  Improper form during certain movements.  Other injuries that surround or cause stress on the mid-back, causing a strain on the muscles. In some cases, the cause may not be known. RISK FACTORS This injury is more common in:  Athletes.  People with obesity. SYMPTOMS The main symptom of this condition is pain, especially with movement. Other symptoms include:  Bruising.  Swelling.  Spasm. DIAGNOSIS This condition may be diagnosed with a physical exam. X-rays may be taken to check for a fracture. TREATMENT This condition may be treated with:  Resting and icing the injured area.  Physical therapy. This will involve doing stretching and strengthening exercises.  Medicines for pain and inflammation. HOME CARE INSTRUCTIONS  Rest as needed. Follow instructions from your health care provider about any restrictions on activity.  If directed, apply ice to the injured area:  Put ice in a plastic bag.  Place a towel between your skin and the bag.  Leave the ice on for 20 minutes, 2-3 times per day.  Take over-the-counter and prescription medicines only as told by your health care provider.  Begin doing exercises as told by your health care provider or physical therapist.  Always  warm up properly before physical activity or sports.  Bend your knees before you lift heavy objects.  Keep all follow-up visits as told by your health care provider. This is important. SEEK MEDICAL CARE IF:  Your pain is not helped by medicine.  Your pain, bruising, or swelling is getting worse.  You have a fever. SEEK IMMEDIATE MEDICAL CARE IF:  You have shortness of breath.  You have chest pain.  You develop numbness or weakness in your legs.  You have involuntary loss of urine (urinary incontinence).   This information is not intended to replace advice given to you by your health care provider. Make sure you discuss any questions you have with your health care provider.   Document Released: 01/28/2004 Document Revised: 07/29/2015 Document Reviewed: 01/01/2015 Elsevier Interactive Patient Education 2016 Else[employer] Inc.  

## 2017-03-27 ENCOUNTER — Other Ambulatory Visit: Payer: Self-pay | Admitting: Gynecology

## 2017-03-27 DIAGNOSIS — Z1231 Encounter for screening mammogram for malignant neoplasm of breast: Secondary | ICD-10-CM

## 2017-04-12 ENCOUNTER — Ambulatory Visit
Admission: RE | Admit: 2017-04-12 | Discharge: 2017-04-12 | Disposition: A | Discharge status: Home / Self Care | Payer: No Typology Code available for payment source | Source: Ambulatory Visit | Attending: Gynecology | Admitting: Gynecology

## 2017-04-12 DIAGNOSIS — Z1231 Encounter for screening mammogram for malignant neoplasm of breast: Secondary | ICD-10-CM

## 2017-05-18 ENCOUNTER — Encounter: Payer: Self-pay | Admitting: Gynecology

## 2017-05-18 ENCOUNTER — Ambulatory Visit (INDEPENDENT_AMBULATORY_CARE_PROVIDER_SITE_OTHER): Payer: No Typology Code available for payment source | Admitting: Gynecology

## 2017-05-18 VITALS — BP 114/70 | Ht 67.0 in | Wt 181.0 lb

## 2017-05-18 DIAGNOSIS — Z01411 Encounter for gynecological examination (general) (routine) with abnormal findings: Secondary | ICD-10-CM

## 2017-05-18 DIAGNOSIS — N952 Postmenopausal atrophic vaginitis: Secondary | ICD-10-CM

## 2017-05-18 DIAGNOSIS — N951 Menopausal and female climacteric states: Secondary | ICD-10-CM | POA: Diagnosis not present

## 2017-05-18 MED ORDER — PROGESTERONE MICRONIZED 100 MG PO CAPS: 100.0000 mg | ORAL_CAPSULE | Freq: Every day | ORAL | 11 refills | Status: DC

## 2017-05-18 MED ORDER — ESTRADIOL 0.5 MG PO TABS: 0.5000 mg | ORAL_TABLET | Freq: Every day | ORAL | 11 refills | Status: DC

## 2017-05-18 NOTE — Patient Instructions (Addendum)
Annual exam in one year  Start on the hormone replacement as we discussed. Call if you had any issues with bleeding or you do not feel that you're getting an adequate response and we can adjust the dose.  [facility] your colonoscopy with either:  [facility]   Address: [address], [place], [place] [address]  [phone]    or  [facility]  Address: [address], [place], [place] [address]  [phone]

## 2017-05-18 NOTE — Addendum Note (Signed)
Addended by: Dayna BarkerGARDNER, Pachia Strum K on: 05/18/2017 12:10 PM   Modules accepted: Orders

## 2017-05-18 NOTE — Progress Notes (Signed)
    Erin SonsCarla H Melaragno 11/06/1961 [id number]        56 y.o.  G2P2003 for annual exam.  Patient having significant hot flushes and sweats with sleep disturbances. No bleeding. Saw a bioidentical clinic and had a number of blood tests done. Is considering pellet therapy. She asked my opinion of this.  Past medical history,surgical history, problem list, medications, allergies, family history and social history were all reviewed and documented as reviewed in the EPIC chart.  ROS:  Performed with pertinent positives and negatives included in the history, assessment and plan.   Additional significant findings :  None   Exam: Kennon PortelaKim Gardner assistant Vitals:   05/18/17 1013  BP: 114/70  Weight: 181 lb (82.1 kg)  Height: 5\' 7"  (1.702 m)   Body mass index is 28.35 kg/m.  General appearance:  Normal affect, orientation and appearance. Skin: Grossly normal HEENT: Without gross lesions.  No cervical or supraclavicular adenopathy. Thyroid normal.  Lungs:  Clear without wheezing, rales or rhonchi Cardiac: RR, without RMG Abdominal:  Soft, nontender, without masses, guarding, rebound, organomegaly or hernia Breasts:  Examined lying and sitting without masses, retractions, discharge or axillary adenopathy. Pelvic:  Ext, BUS, Vagina: With atrophic changes  Cervix: With atrophic changes. Pap smear done  Uterus: Anteverted, normal size, shape and contour, midline and mobile nontender   Adnexa: Without masses or tenderness    Anus and perineum: Normal   Rectovaginal: Normal sphincter tone without palpated masses or tenderness.    Assessment/Plan:  56 y.o. 442P2003 female for annual exam.   1. Postmenopausal/atrophic genital changes/menopausal symptoms. Having increasing hot flushes, night sweats and sleep disturbances. Is considering a bioidentical pellet therapy regiment. I reviewed with her the issues of bioidentical hormone use with less well proven long-term data and the issue of production  consistency. I reviewed the whole issue of HRT to include risks versus benefits. The WHI study as well as the most recent 2017 NAMS guidelines on HRT discussed. Benefits as far as symptom relief impossible cardiovascular and bone health when started early versus risks of thrombosis such as stroke heart attack DVT and breast cancer. After a lengthy discussion the patient wants to go ahead and initiate HRT. I discussed the various routes to include transdermal versus oral. We ultimately decided on estradiol 0.5 mg by mouth and Prometrium 100 mg daily. Patient will initiate and follow for the first month. Will call me if she does not feel she is getting adequate relief of her symptoms. She will call me if she does any vaginal bleeding. 2. Pap smear 2015. Pap smear done today. History of dysplasia 2009 with normal Pap smears since then. 3. Mammography 03/2017. Continue with annual mammography when due. SBE monthly reviewed. Exam normal today. 4. Colonoscopy never. I again recommended screening colonoscopy per current screening guidelines. Names and numbers provided. 5. Health maintenance. No routine blood work done as she had this done elsewhere. Follow up 1 year, sooner as needed.  Additional time in excess of her routine exam was spent in direct face to face counseling and coordination of care in regards to her menopausal symptoms and treatment options with ultimate prescription provided.    Dara LordsFONTAINE,Olegario Emberson P MD, 10:46 AM 05/18/2017

## 2017-05-22 LAB — PAP IG W/ RFLX HPV ASCU

## 2018-04-05 ENCOUNTER — Other Ambulatory Visit: Payer: Self-pay | Admitting: Gynecology

## 2018-04-05 DIAGNOSIS — Z1231 Encounter for screening mammogram for malignant neoplasm of breast: Secondary | ICD-10-CM

## 2018-06-04 ENCOUNTER — Encounter: Payer: Self-pay | Admitting: Gynecology

## 2018-06-04 ENCOUNTER — Ambulatory Visit (INDEPENDENT_AMBULATORY_CARE_PROVIDER_SITE_OTHER): Payer: No Typology Code available for payment source | Admitting: Gynecology

## 2018-06-04 ENCOUNTER — Ambulatory Visit
Admission: RE | Admit: 2018-06-04 | Discharge: 2018-06-04 | Disposition: A | Discharge status: Home / Self Care | Payer: No Typology Code available for payment source | Source: Ambulatory Visit | Attending: Gynecology | Admitting: Gynecology

## 2018-06-04 VITALS — BP 118/78 | Ht 67.0 in | Wt 193.0 lb

## 2018-06-04 DIAGNOSIS — Z1329 Encounter for screening for other suspected endocrine disorder: Secondary | ICD-10-CM

## 2018-06-04 DIAGNOSIS — Z01419 Encounter for gynecological examination (general) (routine) without abnormal findings: Secondary | ICD-10-CM | POA: Diagnosis not present

## 2018-06-04 DIAGNOSIS — Z1321 Encounter for screening for nutritional disorder: Secondary | ICD-10-CM

## 2018-06-04 DIAGNOSIS — Z13220 Encounter for screening for lipoid disorders: Secondary | ICD-10-CM | POA: Diagnosis not present

## 2018-06-04 DIAGNOSIS — Z1231 Encounter for screening mammogram for malignant neoplasm of breast: Secondary | ICD-10-CM

## 2018-06-04 MED ORDER — VALACYCLOVIR HCL 1 G PO TABS: ORAL_TABLET | ORAL | 0 refills | Status: DC

## 2018-06-04 NOTE — Patient Instructions (Signed)
Schedule your colonoscopy with either:  [facility]   Address: [address], [place], [place] [address]  [phone]    or  [facility]  Address: [address], [place], [place] [address]  [phone]      

## 2018-06-04 NOTE — Progress Notes (Signed)
    Melanie SonsCarla H Alvarado 07/18/1961 [id number]        57 y.o.  G2P2003 for annual gynecologic exam.  Doing well without gynecologic complaints.  Past medical history,surgical history, problem list, medications, allergies, family history and social history were all reviewed and documented as reviewed in the EPIC chart.  ROS:  Performed with pertinent positives and negatives included in the history, assessment and plan.   Additional significant findings : None   Exam: Kennon PortelaKim Gardner assistant Vitals:   06/04/18 1448  BP: 118/78  Weight: 193 lb (87.5 kg)  Height: 5\' 7"  (1.702 m)   Body mass index is 30.23 kg/m.  General appearance:  Normal affect, orientation and appearance. Skin: Grossly normal HEENT: Without gross lesions.  No cervical or supraclavicular adenopathy. Thyroid normal.  Lungs:  Clear without wheezing, rales or rhonchi Cardiac: RR, without RMG Abdominal:  Soft, nontender, without masses, guarding, rebound, organomegaly or hernia Breasts:  Examined lying and sitting without masses, retractions, discharge or axillary adenopathy. Pelvic:  Ext, BUS, Vagina: With mild atrophic changes  Cervix: With mild atrophic changes  Uterus: Anteverted, normal size, shape and contour, midline and mobile nontender   Adnexa: Without masses or tenderness    Anus and perineum: Normal   Rectovaginal: Normal sphincter tone without palpated masses or tenderness.    Assessment/Plan:  57 y.o. 712P2003 female for annual gynecologic exam.  1. Postmenopausal/mild atrophic changes.  We discussed HRT last year and initially she was to start estradiol and Prometrium.  She ultimately discontinued and is doing well without significant hot flushes or sweats.  She would prefer to stay off of HRT at this point.  No bleeding.  We will continue to monitor and follow-up if significant symptoms develop or any bleeding. 2. Mammography today.  Breast exam normal today. 3. Colonoscopy never.  I again strongly  recommended she schedule a screening colonoscopy.  Second most common cancer in women reviewed.  Names and numbers provided. 4. DEXA never.  Will plan further into the menopause.  Check vitamin D level today. 5. Pap smear 2018.  No Pap smear done today.  History of dysplasia 2009 with normal Pap smears since.  Plan repeat Pap smear at 3-year interval per current screening guidelines. 6. Health maintenance.  Patient requests baseline labs.  CBC, CMP, lipid profile, TSH, vitamin D and urine analysis ordered.  Follow-up 1 year, sooner as needed.   Dara Lordsimothy P Virgel Haro MD, 3:14 PM 06/04/2018

## 2018-06-05 ENCOUNTER — Other Ambulatory Visit: Payer: Self-pay | Admitting: Gynecology

## 2018-06-05 DIAGNOSIS — E7800 Pure hypercholesterolemia, unspecified: Secondary | ICD-10-CM

## 2018-06-05 DIAGNOSIS — E559 Vitamin D deficiency, unspecified: Secondary | ICD-10-CM

## 2018-06-05 LAB — COMPREHENSIVE METABOLIC PANEL
AG RATIO: 1.8 (calc) (ref 1.0–2.5)
ALKALINE PHOSPHATASE (APISO): 81 U/L (ref 33–130)
ALT: 11 U/L (ref 6–29)
AST: 15 U/L (ref 10–35)
Albumin: 4.2 g/dL (ref 3.6–5.1)
BILIRUBIN TOTAL: 0.7 mg/dL (ref 0.2–1.2)
BUN: 11 mg/dL (ref 7–25)
CALCIUM: 9.2 mg/dL (ref 8.6–10.4)
CHLORIDE: 106 mmol/L (ref 98–110)
CO2: 24 mmol/L (ref 20–32)
Creat: 0.73 mg/dL (ref 0.50–1.05)
GLOBULIN: 2.4 g/dL (ref 1.9–3.7)
Glucose, Bld: 93 mg/dL (ref 65–99)
Potassium: 4 mmol/L (ref 3.5–5.3)
Sodium: 139 mmol/L (ref 135–146)
Total Protein: 6.6 g/dL (ref 6.1–8.1)

## 2018-06-05 LAB — CBC WITH DIFFERENTIAL/PLATELET
BASOS PCT: 0.9 %
Basophils Absolute: 72 cells/uL (ref 0–200)
EOS ABS: 88 {cells}/uL (ref 15–500)
Eosinophils Relative: 1.1 %
HEMATOCRIT: 40.6 % (ref 35.0–45.0)
Hemoglobin: 13.8 g/dL (ref 11.7–15.5)
LYMPHS ABS: 2848 {cells}/uL (ref 850–3900)
MCH: 31.4 pg (ref 27.0–33.0)
MCHC: 34 g/dL (ref 32.0–36.0)
MCV: 92.5 fL (ref 80.0–100.0)
MPV: 10.9 fL (ref 7.5–12.5)
Monocytes Relative: 7.2 %
Neutro Abs: 4416 cells/uL (ref 1500–7800)
Neutrophils Relative %: 55.2 %
Platelets: 232 10*3/uL (ref 140–400)
RBC: 4.39 10*6/uL (ref 3.80–5.10)
RDW: 12.2 % (ref 11.0–15.0)
Total Lymphocyte: 35.6 %
WBC: 8 10*3/uL (ref 3.8–10.8)
WBCMIX: 576 {cells}/uL (ref 200–950)

## 2018-06-05 LAB — TSH: TSH: 1.16 m[IU]/L

## 2018-06-05 LAB — VITAMIN D 25 HYDROXY (VIT D DEFICIENCY, FRACTURES): VIT D 25 HYDROXY: 28 ng/mL — AB (ref 30–100)

## 2018-06-05 LAB — LIPID PANEL
CHOL/HDL RATIO: 3.2 (calc) (ref <5.0)
CHOLESTEROL: 186 mg/dL (ref <200)
HDL: 58 mg/dL (ref >50)
LDL Cholesterol (Calc): 112 mg/dL (calc) — ABNORMAL HIGH
Non-HDL Cholesterol (Calc): 128 mg/dL (calc) (ref <130)
Triglycerides: 71 mg/dL (ref <150)

## 2018-06-06 LAB — URINALYSIS, COMPLETE W/RFL CULTURE
BACTERIA UA: NONE SEEN /HPF
Bilirubin Urine: NEGATIVE
Glucose, UA: NEGATIVE
HGB URINE DIPSTICK: NEGATIVE
Hyaline Cast: NONE SEEN /LPF
KETONES UR: NEGATIVE
NITRITES URINE, INITIAL: NEGATIVE
Protein, ur: NEGATIVE
SPECIFIC GRAVITY, URINE: 1.019 (ref 1.001–1.03)
pH: 5.5 (ref 5.0–8.0)

## 2018-06-06 LAB — URINE CULTURE
MICRO NUMBER:: 90840254
SPECIMEN QUALITY: ADEQUATE

## 2018-06-06 LAB — CULTURE INDICATED

## 2019-05-17 ENCOUNTER — Other Ambulatory Visit: Payer: Self-pay

## 2019-05-22 ENCOUNTER — Other Ambulatory Visit: Payer: Self-pay

## 2019-05-23 ENCOUNTER — Encounter: Payer: Self-pay | Admitting: Family Medicine

## 2019-05-23 ENCOUNTER — Ambulatory Visit (INDEPENDENT_AMBULATORY_CARE_PROVIDER_SITE_OTHER): Payer: No Typology Code available for payment source | Admitting: Family Medicine

## 2019-05-23 VITALS — BP 120/77 | HR 81 | Temp 97.1°F | Ht 67.0 in | Wt 193.4 lb

## 2019-05-23 DIAGNOSIS — M130 Polyarthritis, unspecified: Secondary | ICD-10-CM

## 2019-05-23 MED ORDER — METHYLPREDNISOLONE ACETATE 80 MG/ML IJ SUSP
80.0000 mg | Freq: Once | INTRAMUSCULAR | Status: AC
Start: 2019-05-23 — End: 2019-05-23
  Administered 2019-05-23: 80 mg via INTRAMUSCULAR

## 2019-05-23 MED ORDER — PREDNISONE 20 MG PO TABS: ORAL_TABLET | ORAL | 0 refills | Status: DC

## 2019-05-23 NOTE — Progress Notes (Signed)
BP 120/77   Pulse 81   Temp (!) 97.1 F (36.2 C) (Oral)   Ht 5\' 7"  (1.702 m)   Wt 193 lb 6.4 oz (87.7 kg)   LMP 02/05/2014   BMI 30.29 kg/m    Subjective:   Patient ID: Melanie Alvarado, female    DOB: 03/17/1961, 58 y.o.   MRN: [id number]  HPI: Melanie SonsCarla H Luberto is a 58 y.o. female presenting on 05/23/2019 for hand swelling (Patient states that she has been having bilateral hand swelling, pain and knots have been coming up in different places. x 2 weeks)   HPI Patient comes in complaining of hand swelling that is been bothering her over the past couple weeks.  She says it is come and gone and is starting her right hand and notes in her left hand and affected multiple joints in her hand including her wrists.  She describes it as an achiness or soreness.  Relevant past medical, surgical, family and social history reviewed and updated as indicated. Interim medical history since our last visit reviewed. Allergies and medications reviewed and updated.  Review of Systems  Constitutional: Negative for chills and fever.  Eyes: Negative for visual disturbance.  Respiratory: Negative for chest tightness and shortness of breath.   Cardiovascular: Negative for chest pain and leg swelling.  Musculoskeletal: Positive for arthralgias and joint swelling. Negative for back pain and gait problem.  Skin: Negative for rash.  Neurological: Negative for light-headedness and headaches.  Psychiatric/Behavioral: Negative for agitation and behavioral problems.  All other systems reviewed and are negative.   Per HPI unless specifically indicated above   Allergies as of 05/23/2019   No Known Allergies     Medication List       Accurate as of May 23, 2019 10:23 AM. If you have any questions, ask your nurse or doctor.        CeleBREX 200 MG capsule Generic drug: celecoxib Take 1 tablet by mouth daily.   gabapentin 300 MG capsule Commonly known as: NEURONTIN Take 300 mg by mouth daily.    predniSONE 20 MG tablet Commonly known as: DELTASONE 2 po at same time daily for 5 days Started by: Nils PyleJoshua A Finleigh Cheong, MD   valACYclovir 1000 MG tablet Commonly known as: VALTREX Take 2 tablets at onset then in 12 hours        Objective:   BP 120/77   Pulse 81   Temp (!) 97.1 F (36.2 C) (Oral)   Ht 5\' 7"  (1.702 m)   Wt 193 lb 6.4 oz (87.7 kg)   LMP 02/05/2014   BMI 30.29 kg/m   Wt Readings from Last 3 Encounters:  05/23/19 193 lb 6.4 oz (87.7 kg)  06/04/18 193 lb (87.5 kg)  05/18/17 181 lb (82.1 kg)    Physical Exam Vitals signs and nursing note reviewed.  Constitutional:      General: She is not in acute distress.    Appearance: She is well-developed. She is not diaphoretic.  Eyes:     Conjunctiva/sclera: Conjunctivae normal.  Musculoskeletal: Normal range of motion.        General: No tenderness.     Right hand: She exhibits swelling. She exhibits normal range of motion, no tenderness (No tenderness to palpation in either hand.), normal capillary refill and no deformity. Normal sensation noted.       Hands:  Skin:    General: Skin is warm and dry.     Findings: No rash.  Neurological:  Mental Status: She is alert and oriented to person, place, and time.     Coordination: Coordination normal.  Psychiatric:        Behavior: Behavior normal.       Assessment & Plan:   Problem List Items Addressed This Visit    None    Visit Diagnoses    Polyarthritis of hand    -  Primary   Relevant Medications   celecoxib (CELEBREX) 200 MG capsule   methylPREDNISolone acetate (DEPO-MEDROL) injection 80 mg (Completed) (Start on 05/23/2019 10:30 AM)   predniSONE (DELTASONE) 20 MG tablet   Other Relevant Orders   Arthritis Panel      Patient has polyarthritis in both hands that is been going back and forth over the past few weeks.  She thinks she has had it like this once in the past.  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  She is taking the Celebrex and it does not seem to be helping with this. Follow up plan: Return if symptoms worsen or fail to improve.  Counseling provided for all of the vaccine components Orders Placed This Encounter  Procedures  . Arthritis Panel    Caryl Pina, MD [facility] 05/23/2019, 10:23 AM

## 2019-05-24 LAB — ARTHRITIS PANEL
Basophils Absolute: 0.1 10*3/uL (ref 0.0–0.2)
Basos: 1 %
EOS (ABSOLUTE): 0.1 10*3/uL (ref 0.0–0.4)
Eos: 2 %
Hematocrit: 39.5 % (ref 34.0–46.6)
Hemoglobin: 13.3 g/dL (ref 11.1–15.9)
Immature Grans (Abs): 0 10*3/uL (ref 0.0–0.1)
Immature Granulocytes: 0 %
Lymphocytes Absolute: 2.4 10*3/uL (ref 0.7–3.1)
Lymphs: 36 %
MCH: 31.2 pg (ref 26.6–33.0)
MCHC: 33.7 g/dL (ref 31.5–35.7)
MCV: 93 fL (ref 79–97)
Monocytes Absolute: 0.6 10*3/uL (ref 0.1–0.9)
Monocytes: 8 %
Neutrophils Absolute: 3.6 10*3/uL (ref 1.4–7.0)
Neutrophils: 53 %
Platelets: 220 10*3/uL (ref 150–450)
RBC: 4.26 x10E6/uL (ref 3.77–5.28)
RDW: 11.9 % (ref 11.7–15.4)
Rhuematoid fact SerPl-aCnc: <10 IU/mL (ref 0.0–13.9)
Sed Rate: 13 mm/hr (ref 0–40)
Uric Acid: 4.1 mg/dL (ref 2.5–7.1)
WBC: 6.7 10*3/uL (ref 3.4–10.8)

## 2019-08-20 ENCOUNTER — Other Ambulatory Visit: Payer: Self-pay | Admitting: Gynecology

## 2019-08-20 ENCOUNTER — Encounter: Payer: Self-pay | Admitting: Gynecology

## 2019-08-20 DIAGNOSIS — Z1231 Encounter for screening mammogram for malignant neoplasm of breast: Secondary | ICD-10-CM

## 2019-08-27 ENCOUNTER — Other Ambulatory Visit: Payer: Self-pay

## 2019-08-28 ENCOUNTER — Ambulatory Visit (INDEPENDENT_AMBULATORY_CARE_PROVIDER_SITE_OTHER): Payer: No Typology Code available for payment source | Admitting: Gynecology

## 2019-08-28 ENCOUNTER — Ambulatory Visit
Admission: RE | Admit: 2019-08-28 | Discharge: 2019-08-28 | Disposition: A | Discharge status: Home / Self Care | Payer: No Typology Code available for payment source | Source: Ambulatory Visit | Attending: Gynecology | Admitting: Gynecology

## 2019-08-28 ENCOUNTER — Encounter: Payer: Self-pay | Admitting: Gynecology

## 2019-08-28 VITALS — BP 118/76 | Ht 67.0 in | Wt 191.0 lb

## 2019-08-28 DIAGNOSIS — Z1231 Encounter for screening mammogram for malignant neoplasm of breast: Secondary | ICD-10-CM

## 2019-08-28 DIAGNOSIS — Z13220 Encounter for screening for lipoid disorders: Secondary | ICD-10-CM | POA: Diagnosis not present

## 2019-08-28 DIAGNOSIS — Z01419 Encounter for gynecological examination (general) (routine) without abnormal findings: Secondary | ICD-10-CM | POA: Diagnosis not present

## 2019-08-28 DIAGNOSIS — E559 Vitamin D deficiency, unspecified: Secondary | ICD-10-CM

## 2019-08-28 NOTE — Patient Instructions (Signed)
Schedule your colonoscopy  Follow-up in 1 year for annual exam 

## 2019-08-28 NOTE — Addendum Note (Signed)
Addended by: Nelva Nay on: 08/28/2019 11:48 AM   Modules accepted: Orders

## 2019-08-28 NOTE — Progress Notes (Signed)
    Melanie Alvarado 1961-01-12 [id number]        58 y.o.  G2P2003 for annual gynecologic exam.  Doing well without gynecologic complaints  Past medical history,surgical history, problem list, medications, allergies, family history and social history were all reviewed and documented as reviewed in the EPIC chart.  ROS:  Performed with pertinent positives and negatives included in the history, assessment and plan.   Additional significant findings : None   Exam: Caryn Bee assistant Vitals:   08/28/19 1115  BP: 118/76  Weight: 191 lb (86.6 kg)  Height: 5\' 7"  (1.702 m)   Body mass index is 29.91 kg/m.  General appearance:  Normal affect, orientation and appearance. Skin: Grossly normal HEENT: Without gross lesions.  No cervical or supraclavicular adenopathy. Thyroid normal.  Lungs:  Clear without wheezing, rales or rhonchi Cardiac: RR, without RMG Abdominal:  Soft, nontender, without masses, guarding, rebound, organomegaly or hernia Breasts:  Examined lying and sitting without masses, retractions, discharge or axillary adenopathy. Pelvic:  Ext, BUS, Vagina: Normal with mild atrophic changes  Cervix: Normal with mild atrophic changes.  Pap smear done  Uterus: Anteverted, normal size, shape and contour, midline and mobile nontender   Adnexa: Without masses or tenderness    Anus and perineum: Normal   Rectovaginal: Normal sphincter tone without palpated masses or tenderness.    Assessment/Plan:  58 y.o. G43P2003 female for annual gynecologic exam.   1. Postmenopausal.  No significant menopausal symptoms or any vaginal bleeding. 2. Mammography today.  Breast exam normal today. 3. Colonoscopy never.  I again strongly recommended she schedule a screening colonoscopy.  Second most common cancer in women reviewed.  Patient agrees to do so 4. Pap smear 2018.  Pap smear done today.  History of dysplasia in 2009 with normal Pap smears since. 5. DEXA never.  Will plan further into the  menopause.  History of vitamin D deficiency.  Check vitamin D level today. 6. Health maintenance.  Requests baseline labs.  CBC, CMP, lipid profile, TSH and vitamin D ordered.  Follow-up 1 year, sooner as needed.   Anastasio Auerbach MD, 11:33 AM 08/28/2019

## 2019-08-29 DIAGNOSIS — E559 Vitamin D deficiency, unspecified: Secondary | ICD-10-CM

## 2019-08-29 LAB — CBC WITH DIFFERENTIAL/PLATELET
Absolute Monocytes: 569 cells/uL (ref 200–950)
Basophils Absolute: 78 cells/uL (ref 0–200)
Basophils Relative: 1 %
Eosinophils Absolute: 94 cells/uL (ref 15–500)
Eosinophils Relative: 1.2 %
HCT: 40.2 % (ref 35.0–45.0)
Hemoglobin: 13.3 g/dL (ref 11.7–15.5)
Lymphs Abs: 2753 cells/uL (ref 850–3900)
MCH: 31.4 pg (ref 27.0–33.0)
MCHC: 33.1 g/dL (ref 32.0–36.0)
MCV: 95 fL (ref 80.0–100.0)
MPV: 10.9 fL (ref 7.5–12.5)
Monocytes Relative: 7.3 %
Neutro Abs: 4306 cells/uL (ref 1500–7800)
Neutrophils Relative %: 55.2 %
Platelets: 249 10*3/uL (ref 140–400)
RBC: 4.23 10*6/uL (ref 3.80–5.10)
RDW: 12.3 % (ref 11.0–15.0)
Total Lymphocyte: 35.3 %
WBC: 7.8 10*3/uL (ref 3.8–10.8)

## 2019-08-29 LAB — COMPREHENSIVE METABOLIC PANEL
AG Ratio: 1.8 (calc) (ref 1.0–2.5)
ALT: 12 U/L (ref 6–29)
AST: 16 U/L (ref 10–35)
Albumin: 4.2 g/dL (ref 3.6–5.1)
Alkaline phosphatase (APISO): 83 U/L (ref 37–153)
BUN: 14 mg/dL (ref 7–25)
CO2: 25 mmol/L (ref 20–32)
Calcium: 9.1 mg/dL (ref 8.6–10.4)
Chloride: 105 mmol/L (ref 98–110)
Creat: 0.71 mg/dL (ref 0.50–1.05)
Globulin: 2.4 g/dL (calc) (ref 1.9–3.7)
Glucose, Bld: 84 mg/dL (ref 65–99)
Potassium: 3.6 mmol/L (ref 3.5–5.3)
Sodium: 138 mmol/L (ref 135–146)
Total Bilirubin: 0.6 mg/dL (ref 0.2–1.2)
Total Protein: 6.6 g/dL (ref 6.1–8.1)

## 2019-08-29 LAB — TSH: TSH: 1.07 mIU/L (ref 0.40–4.50)

## 2019-08-29 LAB — LIPID PANEL
Cholesterol: 179 mg/dL (ref <200)
HDL: 60 mg/dL (ref >=50)
LDL Cholesterol (Calc): 104 mg/dL (calc) — ABNORMAL HIGH
Non-HDL Cholesterol (Calc): 119 mg/dL (calc) (ref <130)
Total CHOL/HDL Ratio: 3 (calc) (ref <5.0)
Triglycerides: 60 mg/dL (ref <150)

## 2019-08-29 LAB — VITAMIN D 25 HYDROXY (VIT D DEFICIENCY, FRACTURES): Vit D, 25-Hydroxy: 21 ng/mL — ABNORMAL LOW (ref 30–100)

## 2019-08-30 LAB — PAP IG W/ RFLX HPV ASCU

## 2019-09-02 MED ORDER — VITAMIN D (ERGOCALCIFEROL) 1.25 MG (50000 UNIT) PO CAPS: 50000.0000 [IU] | ORAL_CAPSULE | ORAL | 0 refills | Status: DC

## 2019-10-19 ENCOUNTER — Other Ambulatory Visit: Payer: Self-pay | Admitting: Family Medicine

## 2019-10-19 DIAGNOSIS — M130 Polyarthritis, unspecified: Secondary | ICD-10-CM

## 2019-11-27 ENCOUNTER — Ambulatory Visit: Payer: No Typology Code available for payment source | Admitting: Family Medicine

## 2019-12-02 ENCOUNTER — Ambulatory Visit: Payer: No Typology Code available for payment source | Admitting: Family Medicine

## 2020-04-23 ENCOUNTER — Other Ambulatory Visit: Payer: Self-pay

## 2020-04-23 ENCOUNTER — Encounter: Payer: Self-pay | Admitting: Nurse Practitioner

## 2020-04-23 ENCOUNTER — Ambulatory Visit: Payer: No Typology Code available for payment source | Admitting: Nurse Practitioner

## 2020-04-23 VITALS — BP 135/72 | HR 84 | Temp 96.7°F | Resp 20 | Ht 67.0 in | Wt 195.0 lb

## 2020-04-23 DIAGNOSIS — M25562 Pain in left knee: Secondary | ICD-10-CM | POA: Diagnosis not present

## 2020-04-23 DIAGNOSIS — R600 Localized edema: Secondary | ICD-10-CM | POA: Diagnosis not present

## 2020-04-23 DIAGNOSIS — M25561 Pain in right knee: Secondary | ICD-10-CM

## 2020-04-23 MED ORDER — CELECOXIB 200 MG PO CAPS: 200.0000 mg | ORAL_CAPSULE | Freq: Two times a day (BID) | ORAL | 3 refills | Status: DC

## 2020-04-23 MED ORDER — FUROSEMIDE 20 MG PO TABS: 20.0000 mg | ORAL_TABLET | Freq: Every day | ORAL | 3 refills | Status: DC

## 2020-04-23 MED ORDER — GABAPENTIN 300 MG PO CAPS: 300.0000 mg | ORAL_CAPSULE | Freq: Every day | ORAL | 1 refills | Status: DC

## 2020-04-23 NOTE — Patient Instructions (Signed)
Joint Pain Joint pain (arthralgia) may be caused by many things. Joint pain is likely to go away when you follow instructions from your health care provider for relieving pain at home. However, joint pain can also be caused by conditions that require more treatment. Common causes of joint pain include:  Bruising in the area of the joint.  Injury caused by repeating certain movements too many times (overuse injury).  Age-related joint wear and tear (osteoarthritis).  Buildup of uric acid crystals in the joint (gout).  Inflammation of the joint (rheumatic disease).  Various other forms of arthritis.  Infections of the joint (septic arthritis) or of the bone (osteomyelitis). Your health care provider may recommend that you take pain medicine or wear a supportive device like an elastic bandage, sling, or splint. If your joint pain continues, you may need lab or imaging tests to diagnose the cause of your joint pain. Follow these instructions at home: Managing pain, stiffness, and swelling   If directed, put ice on the painful area. Icing can help to relieve joint pain and swelling. ? Put ice in a plastic bag. ? Place a towel between your skin and the bag. ? Leave the ice on for 20 minutes, 2-3 times a day.  If directed, apply heat to the painful area as often as told by your health care provider. Heat can reduce the stiffness of your muscles and joints. Use the heat source that your health care provider recommends, such as a moist heat pack or a heating pad. ? Place a towel between your skin and the heat source. ? Leave the heat on for 20-30 minutes. ? Remove the heat if your skin turns bright red. This is especially important if you are unable to feel pain, heat, or cold. You may have a greater risk of getting burned.  Move your fingers or toes below the painful joint often. You can avoid stiffness and lessen swelling by doing this.  If possible, raise (elevate) the painful joint above  the level of your heart while you are sitting or lying down. To do this, try putting a few pillows under the painful joint. Activity  Rest the painful joint for as long as directed. Do not do anything that causes or worsens pain.  Begin exercising or stretching the affected area, as told by your health care provider. Ask your health care provider what types of exercise are safe for you. If you have an elastic bandage, sling, or splint:  Wear the supportive device as told by your health care provider. Remove it only as told by your health care provider.  Loosen the device if your fingers or toes below the joint tingle, become numb, or turn cold and blue.  Keep the device clean.  Ask your health care provider if you should remove the device before bathing. You may need to cover it with a watertight covering when you take a bath or a shower. General instructions  Take over-the-counter and prescription medicines only as told by your health care provider.  Do not use any products that contain nicotine or tobacco, such as cigarettes and e-cigarettes. If you need help quitting, ask your health care provider.  Keep all follow-up visits as told by your health care provider. This is important. Contact a health care provider if:  You have pain that gets worse and does not get better with medicine.  Your joint pain does not improve within 3 days.  You have increased bruising or swelling.    You have a fever.  You lose 10 lb (4.5 kg) or more without trying. Get help right away if:  You cannot move the joint.  Your fingers or toes tingle, become numb, or turn cold and blue.  You have a fever along with a joint that is red, warm, and swollen. Summary  Joint pain (arthralgia) may be caused by many things.  Your health care provider may recommend that you take pain medicine or wear a supportive device like an elastic bandage, sling, or splint.  If your joint pain continues, you may need  tests to diagnose the cause of your joint pain.  Take over-the-counter and prescription medicines only as told by your health care provider. This information is not intended to replace advice given to you by your health care provider. Make sure you discuss any questions you have with your health care provider. Document Revised: 10/20/2017 Document Reviewed: 08/23/2017 Elsevier Patient Education  2020 [employer]  

## 2020-04-23 NOTE — Progress Notes (Signed)
Subjective:    Patient ID: Melanie Alvarado, female    DOB: [birth year], 59 y.o.   MRN: [id number]   Chief Complaint: .joint pain since covid  HPI Patient is coming in today c/o: - joint pain since she had covid in November. Pain is in knees, and ankle. He has superficial vein in bil ankle with swelling. Both leg ae really  bothering her by the end of the day. - patient is on diuretic for  edema but she cannot tell it I working. She wa on lasix many year ago that worked better.  -She did have potassium reading of 2.5 in th epat but wa not put on a uppleent   Review of Systems  Constitutional: Negative for diaphoresis.  Eyes: Negative for pain.  Respiratory: Negative for shortness of breath.   Cardiovascular: Negative for chest pain, palpitations and leg swelling.  Gastrointestinal: Negative for abdominal pain.  Endocrine: Negative for polydipsia.  Skin: Negative for rash.  Neurological: Negative for dizziness, weakness and headaches.  Hematological: Does not bruise/bleed easily.  All other systems reviewed and are negative.      Objective:   Physical Exam Vitals and nursing note reviewed.  Constitutional:      General: She is not in acute distress.    Appearance: Normal appearance. She is well-developed.  HENT:     Head: Normocephalic.     Nose: Nose normal.  Eyes:     Pupils: Pupils are equal, round, and reactive to light.  Neck:     Vascular: No carotid bruit or JVD.  Cardiovascular:     Rate and Rhythm: Normal rate and regular rhythm.     Heart sounds: Normal heart sounds.     Comments: Rope like varicosities bil lower ext mainly in the calf Pulmonary:     Effort: Pulmonary effort is normal. No respiratory distress.     Breath sounds: Normal breath sounds. No wheezing or rales.  Chest:     Chest wall: No tenderness.  Abdominal:     General: Bowel sounds are normal. There is no distension or abdominal bruit.     Palpations: Abdomen is soft. There is no  hepatomegaly, splenomegaly, mass or pulsatile mass.     Tenderness: There is no abdominal tenderness.  Musculoskeletal:        General: Normal range of motion.     Cervical back: Normal range of motion and neck supple.     Comments: No knee effuison Crepitus on right with FROM FROM bil without pain All ligaments are intact Mild bil ankle edema  Lymphadenopathy:     Cervical: No cervical adenopathy.  Skin:    General: Skin is warm and dry.     Comments: Superficial varicosities medial aspect of bil ankle.  Neurological:     Mental Status: She is alert and oriented to person, place, and time.     Deep Tendon Reflexes: Reflexes are normal and symmetric.  Psychiatric:        Behavior: Behavior normal.        Thought Content: Thought content normal.        Judgment: Judgment normal.     Blood pressure 135/72, pulse 84, temperature (!) 96.7 F (35.9 C), temperature source Temporal, resp. rate 20, height '5\' 7"'  (1.702 m), weight 195 lb (88.5 kg), last menstrual period 02/05/2014, SpO2 96 %.        Assessment & Plan:  Melanie Alvarado in today with chief complaint of Joint Swelling (All since  having Covid in November  Wants Vitamin D checked)   1. Arthralgia of both knees Increase celebrex from qd to BID Moist heat Rest Labs pending - Arthritis Panel - celecoxib (CELEBREX) 200 MG capsule; Take 1 capsule (200 mg total) by mouth 2 (two) times daily.  Dispense: 60 capsule; Refill: 3 - gabapentin (NEURONTIN) 300 MG capsule; Take 1 capsule (300 mg total) by mouth daily.  Dispense: 90 capsule; Refill: 1  2. Localized edema May try 12/ tablet of lasix at first compression socks encouraged - furosemide (LASIX) 20 MG tablet; Take 1 tablet (20 mg total) by mouth daily.  Dispense: 30 tablet; Refill: 3 - [id number]+EGFR    The above assessment and management plan was discussed with the patient. The patient verbalized understanding of and has agreed to the management plan. Patient is aware  to call the clinic if symptoms persist or worsen. Patient is aware when to return to the clinic for a follow-up visit. Patient educated on when it is appropriate to go to the emergency department.   Mary-Margaret Hassell Done, FNP

## 2020-04-24 LAB — CMP14+EGFR
ALT: 12 IU/L (ref 0–32)
AST: 17 IU/L (ref 0–40)
Albumin/Globulin Ratio: 1.9 (ref 1.2–2.2)
Albumin: 4.3 g/dL (ref 3.8–4.9)
Alkaline Phosphatase: 98 IU/L (ref 48–121)
BUN/Creatinine Ratio: 23 (ref 9–23)
BUN: 17 mg/dL (ref 6–24)
Bilirubin Total: 0.6 mg/dL (ref 0.0–1.2)
CO2: 23 mmol/L (ref 20–29)
Calcium: 9.2 mg/dL (ref 8.7–10.2)
Chloride: 102 mmol/L (ref 96–106)
Creatinine, Ser: 0.75 mg/dL (ref 0.57–1.00)
GFR calc Af Amer: 103 mL/min/{1.73_m2} (ref >59)
GFR calc non Af Amer: 89 mL/min/{1.73_m2} (ref >59)
Globulin, Total: 2.3 g/dL (ref 1.5–4.5)
Glucose: 96 mg/dL (ref 65–99)
Potassium: 4 mmol/L (ref 3.5–5.2)
Sodium: 138 mmol/L (ref 134–144)
Total Protein: 6.6 g/dL (ref 6.0–8.5)

## 2020-04-24 LAB — ARTHRITIS PANEL
Basophils Absolute: 0.1 10*3/uL (ref 0.0–0.2)
Basos: 1 %
EOS (ABSOLUTE): 0.2 10*3/uL (ref 0.0–0.4)
Eos: 3 %
Hematocrit: 40.5 % (ref 34.0–46.6)
Hemoglobin: 13.6 g/dL (ref 11.1–15.9)
Immature Grans (Abs): 0 10*3/uL (ref 0.0–0.1)
Immature Granulocytes: 0 %
Lymphocytes Absolute: 2.4 10*3/uL (ref 0.7–3.1)
Lymphs: 39 %
MCH: 30.4 pg (ref 26.6–33.0)
MCHC: 33.6 g/dL (ref 31.5–35.7)
MCV: 91 fL (ref 79–97)
Monocytes Absolute: 0.5 10*3/uL (ref 0.1–0.9)
Monocytes: 9 %
Neutrophils Absolute: 3 10*3/uL (ref 1.4–7.0)
Neutrophils: 48 %
Platelets: 259 10*3/uL (ref 150–450)
RBC: 4.47 x10E6/uL (ref 3.77–5.28)
RDW: 13 % (ref 11.7–15.4)
Rheumatoid fact SerPl-aCnc: <10 IU/mL (ref 0.0–13.9)
Sed Rate: 9 mm/hr (ref 0–40)
Uric Acid: 4.6 mg/dL (ref 3.0–7.2)
WBC: 6.2 10*3/uL (ref 3.4–10.8)

## 2020-09-11 ENCOUNTER — Other Ambulatory Visit: Payer: Self-pay | Admitting: Obstetrics and Gynecology

## 2020-09-11 DIAGNOSIS — Z1231 Encounter for screening mammogram for malignant neoplasm of breast: Secondary | ICD-10-CM

## 2020-09-16 ENCOUNTER — Other Ambulatory Visit: Payer: Self-pay | Admitting: Nurse Practitioner

## 2020-09-16 DIAGNOSIS — M25561 Pain in right knee: Secondary | ICD-10-CM

## 2020-09-16 DIAGNOSIS — M25562 Pain in left knee: Secondary | ICD-10-CM

## 2020-09-22 ENCOUNTER — Other Ambulatory Visit: Payer: Self-pay

## 2020-09-22 ENCOUNTER — Ambulatory Visit: Payer: No Typology Code available for payment source | Admitting: Nurse Practitioner

## 2020-09-22 ENCOUNTER — Encounter: Payer: Self-pay | Admitting: Nurse Practitioner

## 2020-09-22 DIAGNOSIS — M25561 Pain in right knee: Secondary | ICD-10-CM

## 2020-09-22 DIAGNOSIS — M25562 Pain in left knee: Secondary | ICD-10-CM

## 2020-09-22 MED ORDER — CELECOXIB 200 MG PO CAPS: 200.0000 mg | ORAL_CAPSULE | Freq: Two times a day (BID) | ORAL | 0 refills | Status: DC

## 2020-09-22 NOTE — Progress Notes (Signed)
Subjective:    Patient ID: Melanie Alvarado, female    DOB: Apr 26, 1961, 59 y.o.   MRN: [id number]   Chief Complaint: Pain in joints (Knees, elbows. Worse from last visit)   HPI Pt has been having joint pain in her elbows and knees since she had Covid last November. Says it had gotten a little better after starting celebrex bid in June but it has gotten worse again in the last 2 weeks. Reports that her entire left arm hurts from shoulder to wrist and it is difficult to pick things up and grasp them. She also takes gabapentin to help with the pain at night. Says going up and down steps aggravates the pain. Also says she has gained weight since last November because she is not able to be as active and thinks that's also contributing to her pain. Describes pain as sharp and rate as 10/10 especially at night. Denies decreased ROM but does report weakness in her extremities.    Review of Systems  Constitutional: Negative.   HENT: Negative.   Eyes: Negative.   Respiratory: Negative.   Cardiovascular: Negative.   Gastrointestinal: Negative.   Genitourinary: Negative.   Musculoskeletal: Positive for arthralgias (knees and elbows).  Skin: Negative.   Neurological: Negative.   Hematological: Negative.   Psychiatric/Behavioral: Negative.        Objective:   Physical Exam Vitals and nursing note reviewed.  Constitutional:      Appearance: Normal appearance.  HENT:     Head: Normocephalic.  Cardiovascular:     Rate and Rhythm: Normal rate and regular rhythm.     Pulses: Normal pulses.     Heart sounds: Normal heart sounds.  Pulmonary:     Effort: Pulmonary effort is normal.     Breath sounds: Normal breath sounds.  Abdominal:     General: Bowel sounds are normal.     Palpations: Abdomen is soft.  Musculoskeletal:        General: Swelling (right ankle) present.     Cervical back: Normal range of motion.  Skin:    General: Skin is warm and dry.  Neurological:     General: No focal  deficit present.     Mental Status: She is alert and oriented to person, place, and time.  Psychiatric:        Mood and Affect: Mood normal.        Behavior: Behavior normal.     BP 106/72   Pulse 75   Temp 97.9 F (36.6 C) (Temporal)   Resp 20   Ht 5\' 7"  (1.702 m)   Wt 204 lb (92.5 kg)   LMP 02/05/2014   SpO2 98%   BMI 31.95 kg/m        Assessment & Plan:  VIKI CARRERA in today with chief complaint of Pain in joints (Knees, elbows. Worse from last visit)   1. Arthralgia of both knees Moist heat Rest - celecoxib (CELEBREX) 200 MG capsule; Take 1 capsule (200 mg total) by mouth 2 (two) times daily.  Dispense: 60 capsule; Refill: 0 - Ambulatory referral to Rheumatology    The above assessment and management plan was discussed with the patient. The patient verbalized understanding of and has agreed to the management plan. Patient is aware to call the clinic if symptoms persist or worsen. Patient is aware when to return to the clinic for a follow-up visit. Patient educated on when it is appropriate to go to the emergency department.   Mary-Margaret  Hassell Done, [place]

## 2020-10-14 ENCOUNTER — Other Ambulatory Visit: Payer: Self-pay

## 2020-10-14 ENCOUNTER — Ambulatory Visit
Admission: RE | Admit: 2020-10-14 | Discharge: 2020-10-14 | Disposition: A | Discharge status: Home / Self Care | Payer: No Typology Code available for payment source | Source: Ambulatory Visit | Attending: Obstetrics and Gynecology | Admitting: Obstetrics and Gynecology

## 2020-10-14 DIAGNOSIS — Z1231 Encounter for screening mammogram for malignant neoplasm of breast: Secondary | ICD-10-CM

## 2020-10-19 ENCOUNTER — Encounter: Payer: Self-pay | Admitting: Obstetrics and Gynecology

## 2020-10-19 ENCOUNTER — Ambulatory Visit (INDEPENDENT_AMBULATORY_CARE_PROVIDER_SITE_OTHER): Payer: No Typology Code available for payment source | Admitting: Obstetrics and Gynecology

## 2020-10-19 ENCOUNTER — Other Ambulatory Visit: Payer: Self-pay

## 2020-10-19 VITALS — BP 126/84 | Ht 66.5 in | Wt 197.0 lb

## 2020-10-19 DIAGNOSIS — Z1329 Encounter for screening for other suspected endocrine disorder: Secondary | ICD-10-CM | POA: Diagnosis not present

## 2020-10-19 DIAGNOSIS — Z8639 Personal history of other endocrine, nutritional and metabolic disease: Secondary | ICD-10-CM | POA: Diagnosis not present

## 2020-10-19 DIAGNOSIS — Z01419 Encounter for gynecological examination (general) (routine) without abnormal findings: Secondary | ICD-10-CM | POA: Diagnosis not present

## 2020-10-19 DIAGNOSIS — Z13220 Encounter for screening for lipoid disorders: Secondary | ICD-10-CM | POA: Diagnosis not present

## 2020-10-19 NOTE — Progress Notes (Signed)
   Melanie Alvarado Aug 22, 1961 [id number]  SUBJECTIVE:  59 y.o. G64P2003 female for annual routine gynecologic exam. She has no gynecologic concerns.  Current Outpatient Medications  Medication Sig Dispense Refill  . cefdinir (OMNICEF) 125 MG/5ML suspension Take by mouth 2 (two) times daily.    . celecoxib (CELEBREX) 200 MG capsule Take 1 capsule (200 mg total) by mouth 2 (two) times daily. 60 capsule 0  . furosemide (LASIX) 20 MG tablet Take 1 tablet (20 mg total) by mouth daily. 30 tablet 3  . gabapentin (NEURONTIN) 300 MG capsule Take 1 capsule (300 mg total) by mouth daily. 90 capsule 1  . valACYclovir (VALTREX) 1000 MG tablet Take 2 tablets at onset then in 12 hours 30 tablet 0   Current Facility-Administered Medications  Medication Dose Route Frequency Provider Last Rate Last Admin  . triamcinolone acetonide (KENALOG-40) injection 60 mg  60 mg Intramuscular Once Deatra Canter, FNP       Allergies: Patient has no known allergies.  Patient's last menstrual period was 02/05/2014.  Past medical history,surgical history, problem list, medications, allergies, family history and social history were all reviewed and documented as reviewed in the EPIC chart.  ROS: Pertinent positives and negatives as reviewed in HPI   OBJECTIVE:  BP 126/84   Ht 5' 6.5" (1.689 m)   Wt 197 lb (89.4 kg)   LMP 02/05/2014   BMI 31.32 kg/m  The patient appears well, alert, oriented, in no distress. ENT normal.  Neck supple. No cervical or supraclavicular adenopathy or thyromegaly.  Lungs are clear, good air entry, no wheezes, rhonchi or rales. S1 and S2 normal, no murmurs, regular rate and rhythm.  Abdomen soft without tenderness, guarding, mass or organomegaly.  Neurological is normal, no focal findings.  BREAST EXAM: breasts appear normal, no suspicious masses, no skin or nipple changes or axillary nodes  PELVIC EXAM: VULVA: normal appearing vulva with atrophic change, no masses, tenderness or  lesions, VAGINA: normal appearing vagina with trophic change, normal color and discharge, no lesions, CERVIX: normal appearing cervix without discharge or lesions, UTERUS: uterus is normal size, shape, consistency and nontender, ADNEXA: normal adnexa in size, nontender and no masses  Chaperone: Berna Spare present during the examination  ASSESSMENT:  59 y.o. G2P2003 here for annual gynecologic exam  PLAN:   1. Postmenopausal.  No significant hot flashes or night sweats.  No vaginal bleeding. 2. Pap smear 08/2019.  History of dysplasia 2009, normal Pap smears since then.  Next Pap smear due 2023 following the current guidelines recommending the 3 year interval. 3. Mammogram 09/2020.  Normal breast exam today.  She will continue with annual mammograms. 4. Colonoscopy never.  Says she has been doing Cologuard tests through her workplace that have been normal.  She will follow up with colon cancer screening per her primary doctor's recommendations. 5. DEXA never.  Will plan further into menopause.  Vitamin D deficiency in the past, she is taking supplement.  We will check vitamin D level. 6. Health maintenance.  She will proceed to lab today for routine screening blood work (lipids, CBC, CMP, TSH).  We will check vitamin D level as above.  Return annually or sooner, prn.  Theresia Majors MD 10/19/20

## 2020-10-20 LAB — COMPREHENSIVE METABOLIC PANEL
AG Ratio: 1.6 (calc) (ref 1.0–2.5)
ALT: 12 U/L (ref 6–29)
AST: 15 U/L (ref 10–35)
Albumin: 3.9 g/dL (ref 3.6–5.1)
Alkaline phosphatase (APISO): 93 U/L (ref 37–153)
BUN: 15 mg/dL (ref 7–25)
CO2: 23 mmol/L (ref 20–32)
Calcium: 9 mg/dL (ref 8.6–10.4)
Chloride: 106 mmol/L (ref 98–110)
Creat: 0.64 mg/dL (ref 0.50–1.05)
Globulin: 2.4 g/dL (calc) (ref 1.9–3.7)
Glucose, Bld: 87 mg/dL (ref 65–99)
Potassium: 3.4 mmol/L — ABNORMAL LOW (ref 3.5–5.3)
Sodium: 141 mmol/L (ref 135–146)
Total Bilirubin: 0.5 mg/dL (ref 0.2–1.2)
Total Protein: 6.3 g/dL (ref 6.1–8.1)

## 2020-10-20 LAB — LIPID PANEL
Cholesterol: 181 mg/dL (ref <200)
HDL: 61 mg/dL (ref >=50)
LDL Cholesterol (Calc): 99 mg/dL (calc)
Non-HDL Cholesterol (Calc): 120 mg/dL (calc) (ref <130)
Total CHOL/HDL Ratio: 3 (calc) (ref <5.0)
Triglycerides: 112 mg/dL (ref <150)

## 2020-10-20 LAB — CBC
HCT: 38.8 % (ref 35.0–45.0)
Hemoglobin: 13.4 g/dL (ref 11.7–15.5)
MCH: 32 pg (ref 27.0–33.0)
MCHC: 34.5 g/dL (ref 32.0–36.0)
MCV: 92.6 fL (ref 80.0–100.0)
MPV: 10.7 fL (ref 7.5–12.5)
Platelets: 247 10*3/uL (ref 140–400)
RBC: 4.19 10*6/uL (ref 3.80–5.10)
RDW: 12.1 % (ref 11.0–15.0)
WBC: 7.5 10*3/uL (ref 3.8–10.8)

## 2020-10-20 LAB — TSH: TSH: 1.03 mIU/L (ref 0.40–4.50)

## 2020-10-20 LAB — VITAMIN D 25 HYDROXY (VIT D DEFICIENCY, FRACTURES): Vit D, 25-Hydroxy: 25 ng/mL — ABNORMAL LOW (ref 30–100)

## 2020-10-23 ENCOUNTER — Other Ambulatory Visit: Payer: Self-pay | Admitting: Nurse Practitioner

## 2020-10-23 DIAGNOSIS — M25561 Pain in right knee: Secondary | ICD-10-CM

## 2020-12-24 ENCOUNTER — Other Ambulatory Visit: Payer: No Typology Code available for payment source

## 2020-12-24 ENCOUNTER — Other Ambulatory Visit: Payer: Self-pay

## 2021-01-01 ENCOUNTER — Other Ambulatory Visit: Payer: Self-pay | Admitting: Nurse Practitioner

## 2021-01-01 DIAGNOSIS — R600 Localized edema: Secondary | ICD-10-CM

## 2021-01-20 ENCOUNTER — Other Ambulatory Visit: Payer: Self-pay | Admitting: Nurse Practitioner

## 2021-01-20 DIAGNOSIS — M25562 Pain in left knee: Secondary | ICD-10-CM

## 2021-01-20 DIAGNOSIS — M25561 Pain in right knee: Secondary | ICD-10-CM

## 2021-02-23 ENCOUNTER — Other Ambulatory Visit: Payer: No Typology Code available for payment source

## 2021-02-23 ENCOUNTER — Other Ambulatory Visit: Payer: Self-pay

## 2021-04-23 ENCOUNTER — Other Ambulatory Visit: Payer: Self-pay | Admitting: Nurse Practitioner

## 2021-04-23 DIAGNOSIS — M25561 Pain in right knee: Secondary | ICD-10-CM

## 2021-07-02 ENCOUNTER — Ambulatory Visit: Payer: No Typology Code available for payment source | Admitting: Family Medicine

## 2021-07-02 ENCOUNTER — Encounter: Payer: Self-pay | Admitting: Family Medicine

## 2021-07-02 ENCOUNTER — Ambulatory Visit (INDEPENDENT_AMBULATORY_CARE_PROVIDER_SITE_OTHER): Payer: No Typology Code available for payment source

## 2021-07-02 ENCOUNTER — Other Ambulatory Visit: Payer: Self-pay

## 2021-07-02 VITALS — BP 110/72 | HR 103 | Temp 98.3°F | Ht 66.5 in | Wt 187.4 lb

## 2021-07-02 DIAGNOSIS — R0789 Other chest pain: Secondary | ICD-10-CM

## 2021-07-02 DIAGNOSIS — M940 Chondrocostal junction syndrome [Tietze]: Secondary | ICD-10-CM

## 2021-07-02 DIAGNOSIS — R059 Cough, unspecified: Secondary | ICD-10-CM

## 2021-07-02 MED ORDER — PREDNISONE 20 MG PO TABS: ORAL_TABLET | ORAL | 0 refills | Status: DC

## 2021-07-02 NOTE — Progress Notes (Signed)
Subjective:  Patient ID: Melanie Alvarado, female    DOB: [birth year], 60 y.o.   MRN: [id number]  Patient Care Team: Dettinger, Elige Radon, MD as PCP - General (Family Medicine)   Chief Complaint:  Chest Pain (X 1 week since burning leaves. Burning in her chest when taking deep breath)   HPI: Melanie Alvarado is a 60 y.o. female presenting on 07/02/2021 for Chest Pain (X 1 week since burning leaves. Burning in her chest when taking deep breath)   Pt states she was burning leaves one week ago. States 2 days after burning the leaves she developed a cough and has tenderness to her right chest wall. States it hurts to take a deep breath and to move certain ways.   Chest Pain  This is a new problem. The current episode started 1 to 4 weeks ago. The problem occurs rarely. The problem has been waxing and waning. The pain is present in the lateral region. The pain is at a severity of 3/10. The pain is mild. The quality of the pain is described as sharp. The pain does not radiate. Associated symptoms include a cough. Pertinent negatives include no abdominal pain, back pain, claudication, diaphoresis, dizziness, exertional chest pressure, fever, headaches, hemoptysis, irregular heartbeat, leg pain, lower extremity edema, malaise/fatigue, nausea, near-syncope, numbness, palpitations, PND, shortness of breath, sputum production, syncope, vomiting or weakness. The cough's precipitants include smoke. The cough is Non-productive. Nothing relieves the cough. Nothing worsens the cough. The pain is aggravated by deep breathing, lifting, movement, lifting arm and coughing (palpation). She has tried nothing for the symptoms. The treatment provided no relief.  Pertinent negatives for past medical history include no seizures. Prior diagnostic workup includes chest x-ray.   Relevant past medical, surgical, family, and social history reviewed and updated as indicated.  Allergies and medications reviewed and updated. Data  reviewed: Chart in Epic.   Past Medical History:  Diagnosis Date   Allergy    Arthritis    Varicose veins     Past Surgical History:  Procedure Laterality Date   COLPOSCOPY  2009 approximately   Cervical dysplasia   ENDOMETRIAL ABLATION     HER OPTION   EXCISION OF BASAL CELL  2011   PELVIC LAPAROSCOPY  1996   LASER OF ENDOMETRIOSIS,DRAIN LEFT OV CYST    Social History   Socioeconomic History   Marital status: Married    Spouse name: Not on file   Number of children: Not on file   Years of education: Not on file   Highest education level: Not on file  Occupational History   Not on file  Tobacco Use   Smoking status: Former    Types: Cigarettes    Quit date: 08/22/1993    Years since quitting: 27.8   Smokeless tobacco: Never  Vaping Use   Vaping Use: Never used  Substance and Sexual Activity   Alcohol use: Yes    Alcohol/week: 0.0 standard drinks    Comment: Rare   Drug use: No   Sexual activity: Yes    Partners: Male    Birth control/protection: Post-menopausal    Comment: 1st intercourse 60 yo-Fewer than 5 partners  Other Topics Concern   Not on file  Social History Narrative   Not on file   Social Determinants of Health   Financial Resource Strain: Not on file  Food Insecurity: Not on file  Transportation Needs: Not on file  Physical Activity: Not on file  Stress: Not  on file  Social Connections: Not on file  Intimate Partner Violence: Not on file    Outpatient Encounter Medications as of 07/02/2021  Medication Sig   celecoxib (CELEBREX) 200 MG capsule TAKE 1 CAPSULE BY MOUTH 2 TIMES DAILY   furosemide (LASIX) 20 MG tablet Take 1 tablet (20 mg total) by mouth daily. (Needs to be seen before next refill)   gabapentin (NEURONTIN) 300 MG capsule Take 1 capsule (300 mg total) by mouth daily. (NEEDS TO BE SEEN BEFORE NEXT REFILL)   methotrexate (RHEUMATREX) 2.5 MG tablet Take 2.5 mg by mouth once a week. Takes 5 tables BID weekly   predniSONE  (DELTASONE) 20 MG tablet 2 po at sametime daily for 5 days- start tomorrow   valACYclovir (VALTREX) 1000 MG tablet Take 2 tablets at onset then in 12 hours   [DISCONTINUED] cefdinir (OMNICEF) 125 MG/5ML suspension Take by mouth 2 (two) times daily.   Facility-Administered Encounter Medications as of 07/02/2021  Medication   triamcinolone acetonide (KENALOG-40) injection 60 mg    No Known Allergies  Review of Systems  Constitutional:  Negative for activity change, appetite change, chills, diaphoresis, fatigue, fever, malaise/fatigue and unexpected weight change.  HENT: Negative.    Eyes: Negative.   Respiratory:  Positive for cough. Negative for hemoptysis, sputum production, choking, chest tightness, shortness of breath, wheezing and stridor.        Chest wall pain/tenderness  Cardiovascular:  Positive for chest pain. Negative for palpitations, claudication, leg swelling, syncope, PND and near-syncope.  Gastrointestinal:  Negative for abdominal pain, blood in stool, constipation, diarrhea, nausea and vomiting.  Endocrine: Negative.   Genitourinary:  Negative for decreased urine volume, dysuria, frequency and urgency.  Musculoskeletal:  Negative for arthralgias, back pain and myalgias.  Skin: Negative.  Negative for color change and pallor.  Allergic/Immunologic: Negative.   Neurological:  Negative for dizziness, tremors, seizures, syncope, facial asymmetry, speech difficulty, weakness, light-headedness, numbness and headaches.  Hematological: Negative.   Psychiatric/Behavioral:  Negative for confusion, decreased concentration, hallucinations, sleep disturbance and suicidal ideas.   All other systems reviewed and are negative.      Objective:  BP 110/72   Pulse (!) 103   Temp 98.3 F (36.8 C) (Temporal)   Ht 5' 6.5" (1.689 m)   Wt 187 lb 6.4 oz (85 kg)   LMP 02/05/2014   SpO2 96%   BMI 29.79 kg/m    Wt Readings from Last 3 Encounters:  07/02/21 187 lb 6.4 oz (85 kg)   10/19/20 197 lb (89.4 kg)  09/22/20 204 lb (92.5 kg)    Physical Exam Vitals and nursing note reviewed.  Constitutional:      General: She is not in acute distress.    Appearance: Normal appearance. She is well-developed, well-groomed and normal weight. She is not ill-appearing, toxic-appearing or diaphoretic.  HENT:     Head: Normocephalic and atraumatic.     Jaw: There is normal jaw occlusion.     Right Ear: Hearing normal.     Left Ear: Hearing normal.     Nose: Nose normal.     Mouth/Throat:     Lips: Pink.     Mouth: Mucous membranes are moist.     Pharynx: Oropharynx is clear. Uvula midline.  Eyes:     General: Lids are normal.     Extraocular Movements: Extraocular movements intact.     Conjunctiva/sclera: Conjunctivae normal.     Pupils: Pupils are equal, round, and reactive to light.  Neck:  Thyroid: No thyroid mass, thyromegaly or thyroid tenderness.     Vascular: No carotid bruit or JVD.     Trachea: Trachea and phonation normal.  Cardiovascular:     Rate and Rhythm: Normal rate and regular rhythm.     Chest Wall: PMI is not displaced.     Pulses: Normal pulses.     Heart sounds: Normal heart sounds. No murmur heard.   No friction rub. No gallop.  Pulmonary:     Effort: Pulmonary effort is normal. No tachypnea, accessory muscle usage or respiratory distress.     Breath sounds: Normal breath sounds. No stridor. No wheezing, rhonchi or rales.  Chest:     Chest wall: Tenderness present. No mass, lacerations, deformity, crepitus or edema. There is no dullness to percussion.    Abdominal:     General: Bowel sounds are normal. There is no distension or abdominal bruit.     Palpations: Abdomen is soft. There is no hepatomegaly or splenomegaly.     Tenderness: There is no abdominal tenderness. There is no right CVA tenderness or left CVA tenderness.     Hernia: No hernia is present.  Musculoskeletal:        General: Normal range of motion.     Cervical back:  Normal range of motion and neck supple.     Right lower leg: No edema.     Left lower leg: No edema.  Lymphadenopathy:     Cervical: No cervical adenopathy.  Skin:    General: Skin is warm and dry.     Capillary Refill: Capillary refill takes less than 2 seconds.     Coloration: Skin is not cyanotic, jaundiced or pale.     Findings: No rash.  Neurological:     General: No focal deficit present.     Mental Status: She is alert and oriented to person, place, and time.     Cranial Nerves: Cranial nerves are intact. No cranial nerve deficit.     Sensory: Sensation is intact. No sensory deficit.     Motor: Motor function is intact. No weakness.     Coordination: Coordination is intact. Coordination normal.     Gait: Gait is intact. Gait normal.     Deep Tendon Reflexes: Reflexes are normal and symmetric. Reflexes normal.  Psychiatric:        Attention and Perception: Attention and perception normal.        Mood and Affect: Mood and affect normal.        Speech: Speech normal.        Behavior: Behavior normal. Behavior is cooperative.        Thought Content: Thought content normal.        Cognition and Memory: Cognition and memory normal.        Judgment: Judgment normal.    Results for orders placed or performed in visit on 10/19/20  CBC  Result Value Ref Range   WBC 7.5 3.8 - 10.8 Thousand/uL   RBC 4.19 3.80 - 5.10 Million/uL   Hemoglobin 13.4 11.7 - 15.5 g/dL   HCT 22.2 97.9 - 89.2 %   MCV 92.6 80.0 - 100.0 fL   MCH 32.0 27.0 - 33.0 pg   MCHC 34.5 32.0 - 36.0 g/dL   RDW 11.9 41.7 - 40.8 %   Platelets 247 140 - 400 Thousand/uL   MPV 10.7 7.5 - 12.5 fL  Comprehensive metabolic panel  Result Value Ref Range   Glucose, Bld 87 65 - 99  mg/dL   BUN 15 7 - 25 mg/dL   Creat 1.610.64 0.960.50 - 0.451.05 mg/dL   BUN/Creatinine Ratio NOT APPLICABLE 6 - 22 (calc)   Sodium 141 135 - 146 mmol/L   Potassium 3.4 (L) 3.5 - 5.3 mmol/L   Chloride 106 98 - 110 mmol/L   CO2 23 20 - 32 mmol/L   Calcium  9.0 8.6 - 10.4 mg/dL   Total Protein 6.3 6.1 - 8.1 g/dL   Albumin 3.9 3.6 - 5.1 g/dL   Globulin 2.4 1.9 - 3.7 g/dL (calc)   AG Ratio 1.6 1.0 - 2.5 (calc)   Total Bilirubin 0.5 0.2 - 1.2 mg/dL   Alkaline phosphatase (APISO) 93 37 - 153 U/L   AST 15 10 - 35 U/L   ALT 12 6 - 29 U/L  TSH  Result Value Ref Range   TSH 1.03 0.40 - 4.50 mIU/L  Lipid panel  Result Value Ref Range   Cholesterol 181 <200 mg/dL   HDL 61 > OR = 50 mg/dL   Triglycerides 409112 <811<150 mg/dL   LDL Cholesterol (Calc) 99 mg/dL (calc)   Total CHOL/HDL Ratio 3.0 <5.0 (calc)   Non-HDL Cholesterol (Calc) 120 <130 mg/dL (calc)  VITAMIN D 25 Hydroxy (Vit-D Deficiency, Fractures)  Result Value Ref Range   Vit D, 25-Hydroxy 25 (L) 30 - 100 ng/mL     X-Ray: chest: No acute findings. Preliminary x-ray reading by Kari BaarsMichelle Harvest Stanco, FNP-C, [facility].   Pertinent labs & imaging results that were available during my care of the patient were reviewed by me and considered in my medical decision making.  Assessment & Plan:  Albin FellingCarla was seen today for chest pain.  Diagnoses and all orders for this visit:  Costochondritis Chest wall pain Cough CXR in office without acute findings. Pain with movement, palpation, deep breathing, and cough consistent with costochondritis. No s/s concerning for PNA or ACS. Will burst with steroids. Pt aware to report any new, worsening, or persistent symptoms. Will notify pt if radiology reading differs.  -     predniSONE (DELTASONE) 20 MG tablet; 2 po at sametime daily for 5 days- start tomorrow   Continue all other maintenance medications.  Follow up plan: Return if symptoms worsen or fail to improve.   Continue healthy lifestyle choices, including diet (rich in fruits, vegetables, and lean proteins, and low in salt and simple carbohydrates) and exercise (at least 30 minutes of moderate physical activity daily).  Educational handout given for costochondritis  The above assessment and management plan  was discussed with the patient. The patient verbalized understanding of and has agreed to the management plan. Patient is aware to call the clinic if they develop any new symptoms or if symptoms persist or worsen. Patient is aware when to return to the clinic for a follow-up visit. Patient educated on when it is appropriate to go to the emergency department.   Kari BaarsMichelle Ravinder Hofland, FNP-C [facility] [phone]

## 2021-11-21 DIAGNOSIS — T148XXA Other injury of unspecified body region, initial encounter: Secondary | ICD-10-CM

## 2021-11-21 HISTORY — DX: Other injury of unspecified body region, initial encounter: T14.8XXA

## 2021-11-30 ENCOUNTER — Other Ambulatory Visit: Payer: Self-pay | Admitting: Obstetrics and Gynecology

## 2021-11-30 ENCOUNTER — Other Ambulatory Visit: Payer: Self-pay | Admitting: Obstetrics & Gynecology

## 2021-11-30 DIAGNOSIS — Z1231 Encounter for screening mammogram for malignant neoplasm of breast: Secondary | ICD-10-CM

## 2021-12-16 ENCOUNTER — Ambulatory Visit
Admission: RE | Admit: 2021-12-16 | Discharge: 2021-12-16 | Disposition: A | Discharge status: Home / Self Care | Payer: No Typology Code available for payment source | Source: Ambulatory Visit

## 2021-12-16 DIAGNOSIS — Z1231 Encounter for screening mammogram for malignant neoplasm of breast: Secondary | ICD-10-CM

## 2022-02-10 ENCOUNTER — Ambulatory Visit: Payer: No Typology Code available for payment source | Admitting: Obstetrics & Gynecology

## 2022-02-24 ENCOUNTER — Ambulatory Visit (INDEPENDENT_AMBULATORY_CARE_PROVIDER_SITE_OTHER): Payer: No Typology Code available for payment source | Admitting: Obstetrics & Gynecology

## 2022-02-24 ENCOUNTER — Encounter: Payer: Self-pay | Admitting: Obstetrics & Gynecology

## 2022-02-24 ENCOUNTER — Other Ambulatory Visit (HOSPITAL_COMMUNITY)
Admission: RE | Admit: 2022-02-24 | Discharge: 2022-02-24 | Disposition: A | Discharge status: Home / Self Care | Payer: No Typology Code available for payment source | Source: Ambulatory Visit | Attending: Obstetrics & Gynecology | Admitting: Obstetrics & Gynecology

## 2022-02-24 VITALS — BP 108/68 | HR 70 | Resp 12 | Ht 66.5 in | Wt 193.0 lb

## 2022-02-24 DIAGNOSIS — Z6830 Body mass index (BMI) 30.0-30.9, adult: Secondary | ICD-10-CM | POA: Diagnosis not present

## 2022-02-24 DIAGNOSIS — Z01419 Encounter for gynecological examination (general) (routine) without abnormal findings: Secondary | ICD-10-CM

## 2022-02-24 DIAGNOSIS — Z780 Asymptomatic menopausal state: Secondary | ICD-10-CM | POA: Diagnosis not present

## 2022-02-24 DIAGNOSIS — E6609 Other obesity due to excess calories: Secondary | ICD-10-CM

## 2022-02-24 NOTE — Progress Notes (Signed)
? ? ?Melanie Alvarado [birth year] [id number] ? ? ?History:    61 y.o. G2P2L3 Married ? ?RP:  Established patient presenting for annual gyn exam  ? ?HPI: Postmenopausal, well on no HRT.  No significant hot flashes or night sweats.  No vaginal bleeding.  Occasionally sexually active. Pap smear Neg 08/2019.  History of dysplasia 2009, normal Pap smears since then. Pap reflex today.  Breasts normal. Mammogram Neg 11/2021. BMI 30.68.  Walking.  Lower calorie/carb diet. Will schedule Colonoscopy this year. Vitamin D deficiency in the past, she is taking supplement.  We will check vitamin D level and full fasting Health labs today. ? ?Past medical history,surgical history, family history and social history were all reviewed and documented in the EPIC chart. ? ?Gynecologic History ?Patient's last menstrual period was 02/05/2014. ? ?Obstetric History ?OB History  ?Gravida Para Term Preterm AB Living  ?'2 2 2     3  ' ?SAB IAB Ectopic Multiple Live Births  ?      1    ?  ?# Outcome Date GA Lbr Len/2nd Weight Sex Delivery Anes PTL Lv  ?2 Term           ?1 Term           ? ? ? ?ROS: A ROS was performed and pertinent positives and negatives are included in the history. ? GENERAL: No fevers or chills. HEENT: No change in vision, no earache, sore throat or sinus congestion. NECK: No pain or stiffness. CARDIOVASCULAR: No chest pain or pressure. No palpitations. PULMONARY: No shortness of breath, cough or wheeze. GASTROINTESTINAL: No abdominal pain, nausea, vomiting or diarrhea, melena or bright red blood per rectum. GENITOURINARY: No urinary frequency, urgency, hesitancy or dysuria. MUSCULOSKELETAL: No joint or muscle pain, no back pain, no recent trauma. DERMATOLOGIC: No rash, no itching, no lesions. ENDOCRINE: No polyuria, polydipsia, no heat or cold intolerance. No recent change in weight. HEMATOLOGICAL: No anemia or easy bruising or bleeding. NEUROLOGIC: No headache, seizures, numbness, tingling or weakness. PSYCHIATRIC: No depression,  no loss of interest in normal activity or change in sleep pattern.  ?  ? ?Exam: ? ? ?BP 108/68 (BP Location: Right Arm, Patient Position: Sitting, Cuff Size: Normal)   Pulse 70   Resp 12   Ht 5' 6.5" (1.689 m)   Wt 193 lb (87.5 kg)   LMP 02/05/2014   BMI 30.68 kg/m?  ? ?Body mass index is 30.68 kg/m?. ? ?General appearance : Well developed well nourished female. No acute distress ?HEENT: Eyes: no retinal hemorrhage or exudates,  Neck supple, trachea midline, no carotid bruits, no thyroidmegaly ?Lungs: Clear to auscultation, no rhonchi or wheezes, or rib retractions  ?Heart: Regular rate and rhythm, no murmurs or gallops ?Breast:Examined in sitting and supine position were symmetrical in appearance, no palpable masses or tenderness,  no skin retraction, no nipple inversion, no nipple discharge, no skin discoloration, no axillary or supraclavicular lymphadenopathy ?Abdomen: no palpable masses or tenderness, no rebound or guarding ?Extremities: no edema or skin discoloration or tenderness ? ?Pelvic: Vulva: Normal ?            Vagina: No gross lesions or discharge ? Cervix: No gross lesions or discharge.  Pap reflex done. ? Uterus  AV, normal size, shape and consistency, non-tender and mobile ? Adnexa  Without masses or tenderness ? Anus: Normal ? ? ?Assessment/Plan:  61 y.o. female for annual exam  ? ?1. Encounter for routine gynecological examination with Papanicolaou smear of cervix ?Postmenopausal, well  on no HRT.  No significant hot flashes or night sweats.  No vaginal bleeding.  Occasionally sexually active. Pap smear Neg 08/2019.  History of dysplasia 2009, normal Pap smears since then. Pap reflex today.  Breasts normal. Mammogram Neg 11/2021. BMI 30.68.  Walking.  Lower calorie/carb diet. Will schedule Colonoscopy this year. Vitamin D deficiency in the past, she is taking supplement.  We will check vitamin D level and full fasting Health labs today. ?- CBC ?- Comp Met (CMET) ?- Lipid Profile ?- TSH ?-  Vitamin D (25 hydroxy) ?- Cytology - PAP( [facility]) ? ?2. Postmenopause ?Postmenopausal, well on no HRT.  No significant hot flashes or night sweats.  No vaginal bleeding.  Occasionally sexually active.  ? ?3. Class 1 obesity due to excess calories without serious comorbidity with body mass index (BMI) of 30.0 to 30.9 in adult ?BMI 30.68.  Walking.  Lower calorie/carb diet ? ?Other orders ?- leflunomide (ARAVA) 20 MG tablet; Take 20 mg by mouth daily. ?- BIOTIN PO; Take by mouth. ?- VITAMIN D PO; Take by mouth. ?- Multiple Vitamins-Minerals (ZINC PO); Take by mouth. ?- Ascorbic Acid (VITAMIN C PO); Take by mouth.  ? ?Princess Bruins MD, 10:16 AM 02/24/2022 ? ?  ?

## 2022-02-25 LAB — CBC
HCT: 39.3 % (ref 35.0–45.0)
Hemoglobin: 13 g/dL (ref 11.7–15.5)
MCH: 31.1 pg (ref 27.0–33.0)
MCHC: 33.1 g/dL (ref 32.0–36.0)
MCV: 94 fL (ref 80.0–100.0)
MPV: 11.5 fL (ref 7.5–12.5)
Platelets: 202 10*3/uL (ref 140–400)
RBC: 4.18 10*6/uL (ref 3.80–5.10)
RDW: 12.4 % (ref 11.0–15.0)
WBC: 5.7 10*3/uL (ref 3.8–10.8)

## 2022-02-25 LAB — COMPREHENSIVE METABOLIC PANEL
AG Ratio: 1.8 (calc) (ref 1.0–2.5)
ALT: 17 U/L (ref 6–29)
AST: 23 U/L (ref 10–35)
Albumin: 3.9 g/dL (ref 3.6–5.1)
Alkaline phosphatase (APISO): 99 U/L (ref 37–153)
BUN: 15 mg/dL (ref 7–25)
CO2: 24 mmol/L (ref 20–32)
Calcium: 9 mg/dL (ref 8.6–10.4)
Chloride: 108 mmol/L (ref 98–110)
Creat: 0.63 mg/dL (ref 0.50–1.03)
Globulin: 2.2 g/dL (calc) (ref 1.9–3.7)
Glucose, Bld: 85 mg/dL (ref 65–99)
Potassium: 3.9 mmol/L (ref 3.5–5.3)
Sodium: 139 mmol/L (ref 135–146)
Total Bilirubin: 0.8 mg/dL (ref 0.2–1.2)
Total Protein: 6.1 g/dL (ref 6.1–8.1)

## 2022-02-25 LAB — LIPID PANEL
Cholesterol: 161 mg/dL (ref <200)
HDL: 60 mg/dL (ref >=50)
LDL Cholesterol (Calc): 87 mg/dL (calc)
Non-HDL Cholesterol (Calc): 101 mg/dL (calc) (ref <130)
Total CHOL/HDL Ratio: 2.7 (calc) (ref <5.0)
Triglycerides: 46 mg/dL (ref <150)

## 2022-02-25 LAB — VITAMIN D 25 HYDROXY (VIT D DEFICIENCY, FRACTURES): Vit D, 25-Hydroxy: 45 ng/mL (ref 30–100)

## 2022-02-25 LAB — TSH: TSH: 1.49 mIU/L (ref 0.40–4.50)

## 2022-02-28 ENCOUNTER — Encounter: Payer: Self-pay | Admitting: *Deleted

## 2022-02-28 LAB — CYTOLOGY - PAP
Comment: NEGATIVE
Diagnosis: NEGATIVE
High risk HPV: NEGATIVE

## 2022-05-03 ENCOUNTER — Encounter: Payer: Self-pay | Admitting: Family Medicine

## 2022-05-03 ENCOUNTER — Ambulatory Visit: Payer: No Typology Code available for payment source | Admitting: Family Medicine

## 2022-05-03 VITALS — BP 101/65 | Temp 98.8°F | Ht 66.5 in | Wt 191.0 lb

## 2022-05-03 DIAGNOSIS — W57XXXD Bitten or stung by nonvenomous insect and other nonvenomous arthropods, subsequent encounter: Secondary | ICD-10-CM | POA: Diagnosis not present

## 2022-05-03 DIAGNOSIS — S80862D Insect bite (nonvenomous), left lower leg, subsequent encounter: Secondary | ICD-10-CM | POA: Diagnosis not present

## 2022-05-03 NOTE — Progress Notes (Signed)
Subjective:  Patient ID: Melanie Alvarado, female    DOB: 1961-08-17, 61 y.o.   MRN: [id number]  Patient Care Team: Dettinger, Fransisca Kaufmann, MD as PCP - General (Family Medicine)   Chief Complaint:  Follow-up (Concern for alpha gal)   HPI: Melanie Alvarado is a 61 y.o. female presenting on 05/03/2022 for Follow-up (Concern for alpha gal)   Pt presents today for reevaluation after tick bite. She was seen by UC three weeks ago after removing an embedded tick. She was called by UC yesterday and told her she needed to be checked for Alpha-Gal. She was tested for tick borne illnesses at [facility] and treated empirically with doxycycline. RMSF, lyme, and Ehrlichia were negative. States she ate pork a few days ago and had an upset stomach for 3 days with nausea, vomiting, and diarrhea. She has since eliminated beef and pork from diet without return of symptoms. She did have a rash on her left lower leg after tick was removed and this has since resolved.       Relevant past medical, surgical, family, and social history reviewed and updated as indicated.  Allergies and medications reviewed and updated. Data reviewed: Chart in Epic.   Past Medical History:  Diagnosis Date   Allergy    Arthritis    Varicose veins     Past Surgical History:  Procedure Laterality Date   COLPOSCOPY  2009 approximately   Cervical dysplasia   ENDOMETRIAL ABLATION     HER OPTION   EXCISION OF BASAL CELL  2011   PELVIC LAPAROSCOPY  1996   LASER OF ENDOMETRIOSIS,DRAIN LEFT OV CYST    Social History   Socioeconomic History   Marital status: Married    Spouse name: Not on file   Number of children: Not on file   Years of education: Not on file   Highest education level: Not on file  Occupational History   Not on file  Tobacco Use   Smoking status: Former    Types: Cigarettes    Quit date: 08/22/1993    Years since quitting: 28.7   Smokeless tobacco: Never  Vaping Use   Vaping Use: Never used  Substance and  Sexual Activity   Alcohol use: Yes    Alcohol/week: 0.0 standard drinks of alcohol    Comment: Rare   Drug use: No   Sexual activity: Yes    Partners: Male    Birth control/protection: Post-menopausal    Comment: 1st intercourse 61 yo-Fewer than 5 partners  Other Topics Concern   Not on file  Social History Narrative   Not on file   Social Determinants of Health   Financial Resource Strain: Not on file  Food Insecurity: Not on file  Transportation Needs: Not on file  Physical Activity: Not on file  Stress: Not on file  Social Connections: Not on file  Intimate Partner Violence: Not on file    Outpatient Encounter Medications as of 05/03/2022  Medication Sig   Ascorbic Acid (VITAMIN C PO) Take by mouth.   BIOTIN PO Take by mouth.   celecoxib (CELEBREX) 200 MG capsule TAKE 1 CAPSULE BY MOUTH 2 TIMES DAILY (Patient taking differently: daily.)   furosemide (LASIX) 20 MG tablet Take 1 tablet (20 mg total) by mouth daily. (Needs to be seen before next refill) (Patient taking differently: Take 20 mg by mouth as needed. (Needs to be seen before next refill))   gabapentin (NEURONTIN) 300 MG capsule Take 1 capsule (300  mg total) by mouth daily. (NEEDS TO BE SEEN BEFORE NEXT REFILL)   leflunomide (ARAVA) 20 MG tablet Take 20 mg by mouth daily.   Multiple Vitamins-Minerals (ZINC PO) Take by mouth.   valACYclovir (VALTREX) 1000 MG tablet Take 2 tablets at onset then in 12 hours   VITAMIN D PO Take by mouth.   No facility-administered encounter medications on file as of 05/03/2022.    Allergies  Allergen Reactions   Methohexital Nausea And Vomiting    Review of Systems  Constitutional:  Positive for appetite change. Negative for activity change, chills, diaphoresis, fatigue, fever and unexpected weight change.  Respiratory:  Negative for cough and shortness of breath.   Cardiovascular:  Negative for chest pain, palpitations and leg swelling.  Gastrointestinal:  Positive for abdominal  pain, diarrhea, nausea and vomiting. Negative for abdominal distention, anal bleeding, blood in stool, constipation and rectal pain.  Genitourinary:  Negative for decreased urine volume and difficulty urinating.  Musculoskeletal:  Negative for arthralgias and myalgias.  Skin:  Positive for rash and wound.  Neurological:  Negative for facial asymmetry, weakness and light-headedness.  Psychiatric/Behavioral:  Negative for confusion.   All other systems reviewed and are negative.       Objective:  BP 101/65   Temp 98.8 F (37.1 C)   Ht 5' 6.5" (1.689 m)   Wt 191 lb (86.6 kg)   LMP 01/19/2013   SpO2 95%   BMI 30.37 kg/m    Wt Readings from Last 3 Encounters:  05/03/22 191 lb (86.6 kg)  02/24/22 193 lb (87.5 kg)  07/02/21 187 lb 6.4 oz (85 kg)    Physical Exam Vitals and nursing note reviewed.  Constitutional:      General: She is not in acute distress.    Appearance: Normal appearance. She is obese. She is not ill-appearing, toxic-appearing or diaphoretic.  HENT:     Head: Normocephalic and atraumatic.     Mouth/Throat:     Mouth: Mucous membranes are moist.  Eyes:     Pupils: Pupils are equal, round, and reactive to light.  Cardiovascular:     Rate and Rhythm: Normal rate and regular rhythm.     Heart sounds: Normal heart sounds.  Pulmonary:     Effort: Pulmonary effort is normal.     Breath sounds: Normal breath sounds.  Abdominal:     General: Bowel sounds are normal.     Palpations: Abdomen is soft.     Tenderness: There is no abdominal tenderness.  Skin:    General: Skin is warm and dry.     Capillary Refill: Capillary refill takes less than 2 seconds.  Neurological:     General: No focal deficit present.     Mental Status: She is alert and oriented to person, place, and time.  Psychiatric:        Mood and Affect: Mood normal.        Behavior: Behavior normal.        Thought Content: Thought content normal.        Judgment: Judgment normal.     Results  for orders placed or performed in visit on 02/24/22  CBC  Result Value Ref Range   WBC 5.7 3.8 - 10.8 Thousand/uL   RBC 4.18 3.80 - 5.10 Million/uL   Hemoglobin 13.0 11.7 - 15.5 g/dL   HCT 39.3 35.0 - 45.0 %   MCV 94.0 80.0 - 100.0 fL   MCH 31.1 27.0 - 33.0 pg   MCHC  33.1 32.0 - 36.0 g/dL   RDW 12.4 11.0 - 15.0 %   Platelets 202 140 - 400 Thousand/uL   MPV 11.5 7.5 - 12.5 fL  Comp Met (CMET)  Result Value Ref Range   Glucose, Bld 85 65 - 99 mg/dL   BUN 15 7 - 25 mg/dL   Creat 0.63 0.50 - 1.03 mg/dL   BUN/Creatinine Ratio NOT APPLICABLE 6 - 22 (calc)   Sodium 139 135 - 146 mmol/L   Potassium 3.9 3.5 - 5.3 mmol/L   Chloride 108 98 - 110 mmol/L   CO2 24 20 - 32 mmol/L   Calcium 9.0 8.6 - 10.4 mg/dL   Total Protein 6.1 6.1 - 8.1 g/dL   Albumin 3.9 3.6 - 5.1 g/dL   Globulin 2.2 1.9 - 3.7 g/dL (calc)   AG Ratio 1.8 1.0 - 2.5 (calc)   Total Bilirubin 0.8 0.2 - 1.2 mg/dL   Alkaline phosphatase (APISO) 99 37 - 153 U/L   AST 23 10 - 35 U/L   ALT 17 6 - 29 U/L  Lipid Profile  Result Value Ref Range   Cholesterol 161 <200 mg/dL   HDL 60 > OR = 50 mg/dL   Triglycerides 46 <150 mg/dL   LDL Cholesterol (Calc) 87 mg/dL (calc)   Total CHOL/HDL Ratio 2.7 <5.0 (calc)   Non-HDL Cholesterol (Calc) 101 <130 mg/dL (calc)  TSH  Result Value Ref Range   TSH 1.49 0.40 - 4.50 mIU/L  Vitamin D (25 hydroxy)  Result Value Ref Range   Vit D, 25-Hydroxy 45 30 - 100 ng/mL  Cytology - PAP( [place])  Result Value Ref Range   High risk HPV Negative    Adequacy      Satisfactory for evaluation; transformation zone component PRESENT.   Diagnosis      - Negative for intraepithelial lesion or malignancy (NILM)   Comment Atrophic changes are present.    Comment Normal Reference Range HPV - Negative        Pertinent labs & imaging results that were available during my care of the patient were reviewed by me and considered in my medical decision making.  Assessment & Plan:  Melanie Alvarado was seen today  for follow-up.  Diagnoses and all orders for this visit:  Tick bite of left lower leg, subsequent encounter Panel for tick borne illnesses at UC negative. No Alpha-Gal panel noted. Will check today as pt has had significant GI upset post eating pork. Pt aware to avoid mammalian foods until labs result. -     Alpha-Gal Panel     Continue all other maintenance medications.  Follow up plan: Return if symptoms worsen or fail to improve.   Continue healthy lifestyle choices, including diet (rich in fruits, vegetables, and lean proteins, and low in salt and simple carbohydrates) and exercise (at least 30 minutes of moderate physical activity daily).  Educational handout given for alpha-gal  The above assessment and management plan was discussed with the patient. The patient verbalized understanding of and has agreed to the management plan. Patient is aware to call the clinic if they develop any new symptoms or if symptoms persist or worsen. Patient is aware when to return to the clinic for a follow-up visit. Patient educated on when it is appropriate to go to the emergency department.   Monia Pouch, FNP-C [facility] [phone]

## 2022-05-06 LAB — ALPHA-GAL PANEL
Allergen Lamb IgE: <0.1 kU/L
Beef IgE: 0.17 kU/L — AB
IgE (Immunoglobulin E), Serum: 4 IU/mL — ABNORMAL LOW (ref 6–495)
O215-IgE Alpha-Gal: 0.75 kU/L — AB
Pork IgE: <0.1 kU/L

## 2022-05-13 ENCOUNTER — Other Ambulatory Visit: Payer: Self-pay | Admitting: Nurse Practitioner

## 2022-05-13 DIAGNOSIS — R600 Localized edema: Secondary | ICD-10-CM

## 2022-06-24 ENCOUNTER — Encounter: Payer: Self-pay | Admitting: Family Medicine

## 2022-06-24 ENCOUNTER — Other Ambulatory Visit: Payer: Self-pay

## 2022-06-24 ENCOUNTER — Ambulatory Visit (INDEPENDENT_AMBULATORY_CARE_PROVIDER_SITE_OTHER): Payer: No Typology Code available for payment source

## 2022-06-24 ENCOUNTER — Ambulatory Visit: Payer: No Typology Code available for payment source | Admitting: Family Medicine

## 2022-06-24 VITALS — BP 115/82 | HR 87 | Temp 98.8°F | Ht 66.5 in | Wt 189.0 lb

## 2022-06-24 DIAGNOSIS — M25531 Pain in right wrist: Secondary | ICD-10-CM

## 2022-06-24 DIAGNOSIS — S52531A Colles' fracture of right radius, initial encounter for closed fracture: Secondary | ICD-10-CM

## 2022-06-24 MED ORDER — TRAMADOL HCL 50 MG PO TABS: 50.0000 mg | ORAL_TABLET | Freq: Three times a day (TID) | ORAL | 0 refills | Status: AC | PRN

## 2022-06-24 NOTE — Progress Notes (Signed)
Subjective:  Patient ID: Melanie Alvarado, female    DOB: [birth year], 61 y.o.   MRN: [id number]  Patient Care Team: Dettinger, Elige Radon, MD as PCP - General (Family Medicine)   Chief Complaint:  Wrist Injury   HPI: Melanie Alvarado is a 61 y.o. female presenting on 06/24/2022 for Wrist Injury   Pt presents today for evaluation of right wrist injury with pain and swelling. States she was walking out of the house with clothes in her hand, tripped and fell landing on her lateral right wrist. This caused immediate pain and swelling. She went to the [facility] and was not able to be seen, states not provider in office.   Wrist Injury  The incident occurred 3 to 6 hours ago. The incident occurred at home. The injury mechanism was a fall and a direct blow. The pain is present in the right wrist. The quality of the pain is described as aching, burning and stabbing. The pain does not radiate. The pain is moderate. The pain has been Constant since the incident. Pertinent negatives include no chest pain, muscle weakness, numbness or tingling. The symptoms are aggravated by movement and palpation. She has tried ice for the symptoms. The treatment provided no relief.     Relevant past medical, surgical, family, and social history reviewed and updated as indicated.  Allergies and medications reviewed and updated. Data reviewed: Chart in Epic.   Past Medical History:  Diagnosis Date   Allergy    Arthritis    Varicose veins     Past Surgical History:  Procedure Laterality Date   COLPOSCOPY  2009 approximately   Cervical dysplasia   ENDOMETRIAL ABLATION     HER OPTION   EXCISION OF BASAL CELL  2011   PELVIC LAPAROSCOPY  1996   LASER OF ENDOMETRIOSIS,DRAIN LEFT OV CYST    Social History   Socioeconomic History   Marital status: Married    Spouse name: Not on file   Number of children: Not on file   Years of education: Not on file   Highest education level: Not on file   Occupational History   Not on file  Tobacco Use   Smoking status: Former    Types: Cigarettes    Quit date: 08/22/1993    Years since quitting: 28.8   Smokeless tobacco: Never  Vaping Use   Vaping Use: Never used  Substance and Sexual Activity   Alcohol use: Yes    Alcohol/week: 0.0 standard drinks of alcohol    Comment: Rare   Drug use: No   Sexual activity: Yes    Partners: Male    Birth control/protection: Post-menopausal    Comment: 1st intercourse 61 yo-Fewer than 5 partners  Other Topics Concern   Not on file  Social History Narrative   Not on file   Social Determinants of Health   Financial Resource Strain: Not on file  Food Insecurity: Not on file  Transportation Needs: Not on file  Physical Activity: Not on file  Stress: Not on file  Social Connections: Not on file  Intimate Partner Violence: Not on file    Outpatient Encounter Medications as of 06/24/2022  Medication Sig   Ascorbic Acid (VITAMIN C PO) Take by mouth.   BIOTIN PO Take by mouth.   celecoxib (CELEBREX) 200 MG capsule TAKE 1 CAPSULE BY MOUTH 2 TIMES DAILY (Patient taking differently: daily.)   furosemide (LASIX) 20 MG tablet TAKE ONE (1) TABLET EACH  DAY   gabapentin (NEURONTIN) 300 MG capsule Take 1 capsule (300 mg total) by mouth daily. (NEEDS TO BE SEEN BEFORE NEXT REFILL)   leflunomide (ARAVA) 20 MG tablet Take 20 mg by mouth daily.   Multiple Vitamins-Minerals (ZINC PO) Take by mouth.   traMADol (ULTRAM) 50 MG tablet Take 1 tablet (50 mg total) by mouth every 8 (eight) hours as needed for up to 5 days.   valACYclovir (VALTREX) 1000 MG tablet Take 2 tablets at onset then in 12 hours   VITAMIN D PO Take by mouth.   ENBREL SURECLICK 50 MG/ML injection Inject into the skin.   FOLIC ACID PO folic acid   No facility-administered encounter medications on file as of 06/24/2022.    Allergies  Allergen Reactions   Methohexital Nausea And Vomiting    Review of Systems  Constitutional:  Negative  for activity change, appetite change, chills, diaphoresis, fatigue, fever and unexpected weight change.  Respiratory:  Negative for cough and shortness of breath.   Cardiovascular:  Negative for chest pain, palpitations and leg swelling.  Musculoskeletal:  Positive for arthralgias and joint swelling.  Neurological:  Negative for tingling, weakness, numbness and headaches.  Psychiatric/Behavioral:  Negative for confusion.   All other systems reviewed and are negative.       Objective:  BP 115/82   Pulse 87   Temp 98.8 F (37.1 C)   Ht 5' 6.5" (1.689 m)   Wt 189 lb (85.7 kg)   LMP 01/19/2013   SpO2 95%   BMI 30.05 kg/m    Wt Readings from Last 3 Encounters:  06/24/22 189 lb (85.7 kg)  05/03/22 191 lb (86.6 kg)  02/24/22 193 lb (87.5 kg)    Physical Exam Vitals and nursing note reviewed.  Constitutional:      General: She is in acute distress.     Appearance: Normal appearance. She is not ill-appearing, toxic-appearing or diaphoretic.  HENT:     Head: Normocephalic and atraumatic.     Mouth/Throat:     Mouth: Mucous membranes are moist.  Eyes:     Pupils: Pupils are equal, round, and reactive to light.  Cardiovascular:     Rate and Rhythm: Normal rate and regular rhythm.     Pulses: Normal pulses.     Heart sounds: Normal heart sounds.  Pulmonary:     Effort: Pulmonary effort is normal.     Breath sounds: Normal breath sounds.  Musculoskeletal:     Right elbow: Normal.     Right forearm: Tenderness present.     Right wrist: Swelling, tenderness and bony tenderness present. No deformity, effusion or lacerations. Decreased range of motion.     Right hand: Normal.     Left hand: Normal.       Arms:  Skin:    General: Skin is warm and dry.     Capillary Refill: Capillary refill takes less than 2 seconds.  Neurological:     General: No focal deficit present.     Mental Status: She is alert and oriented to person, place, and time.  Psychiatric:        Mood and  Affect: Mood normal.        Behavior: Behavior normal.        Thought Content: Thought content normal.        Judgment: Judgment normal.     Results for orders placed or performed in visit on 05/03/22  Alpha-Gal Panel  Result Value Ref Range  Class Description Allergens Comment    IgE (Immunoglobulin E), Serum 4 (L) 6 - 495 IU/mL   [id number] Alpha-Gal 0.75 (A) Class II kU/L   Beef IgE 0.17 (A) Class 0/I kU/L   Pork IgE <0.10 Class 0 kU/L   Allergen Lamb IgE <0.10 Class 0 kU/L     X-Ray: Right wrist: distal radius fracture, minimal displacement. Preliminary x-ray reading by Kari Baars, FNP-C, [facility].   Pertinent labs & imaging results that were available during my care of the patient were reviewed by me and considered in my medical decision making.  Assessment & Plan:  O[place]a was seen today for wrist injury.  Diagnoses and all orders for this visit:  Right wrist pain Fracture noted.  -     DG Wrist Complete Right; Future  Closed Colles' fracture of right radius, initial encounter Placed in thumb spica wrist splint. Talked with ortho on call provider. States to have pt come to the [facility]. Directions provided. Short course of tramadol for significant pain. Rest, ice, elevation discussed. -     Ambulatory referral to Orthopedic Surgery -     traMADol (ULTRAM) 50 MG tablet; Take 1 tablet (50 mg total) by mouth every 8 (eight) hours as needed for up to 5 days.     Continue all other maintenance medications.  Follow up plan: Went to [facility]   Continue healthy lifestyle choices, including diet (rich in fruits, vegetables, and lean proteins, and low in salt and simple carbohydrates) and exercise (at least 30 minutes of moderate physical activity daily).  Educational handout given for wrist fracture  The above assessment and management plan was discussed with the patient. The patient verbalized understanding of and has agreed to the management  plan. Patient is aware to call the clinic if they develop any new symptoms or if symptoms persist or worsen. Patient is aware when to return to the clinic for a follow-up visit. Patient educated on when it is appropriate to go to the emergency department.   Kari Baars, FNP-C [facility] [phone]

## 2022-06-24 NOTE — Patient Instructions (Signed)
Tylenol 650 mg every 4-6 hours as needed for pain, max 3000 mg in 24 hours.

## 2022-09-09 IMAGING — DX DG CHEST 2V
2 series · 2 of 2 positions shown · non-contrast
Comparison: None

CLINICAL DATA: Cough, chest pain

EXAM:
CHEST - 2 VIEW

[chest pa]
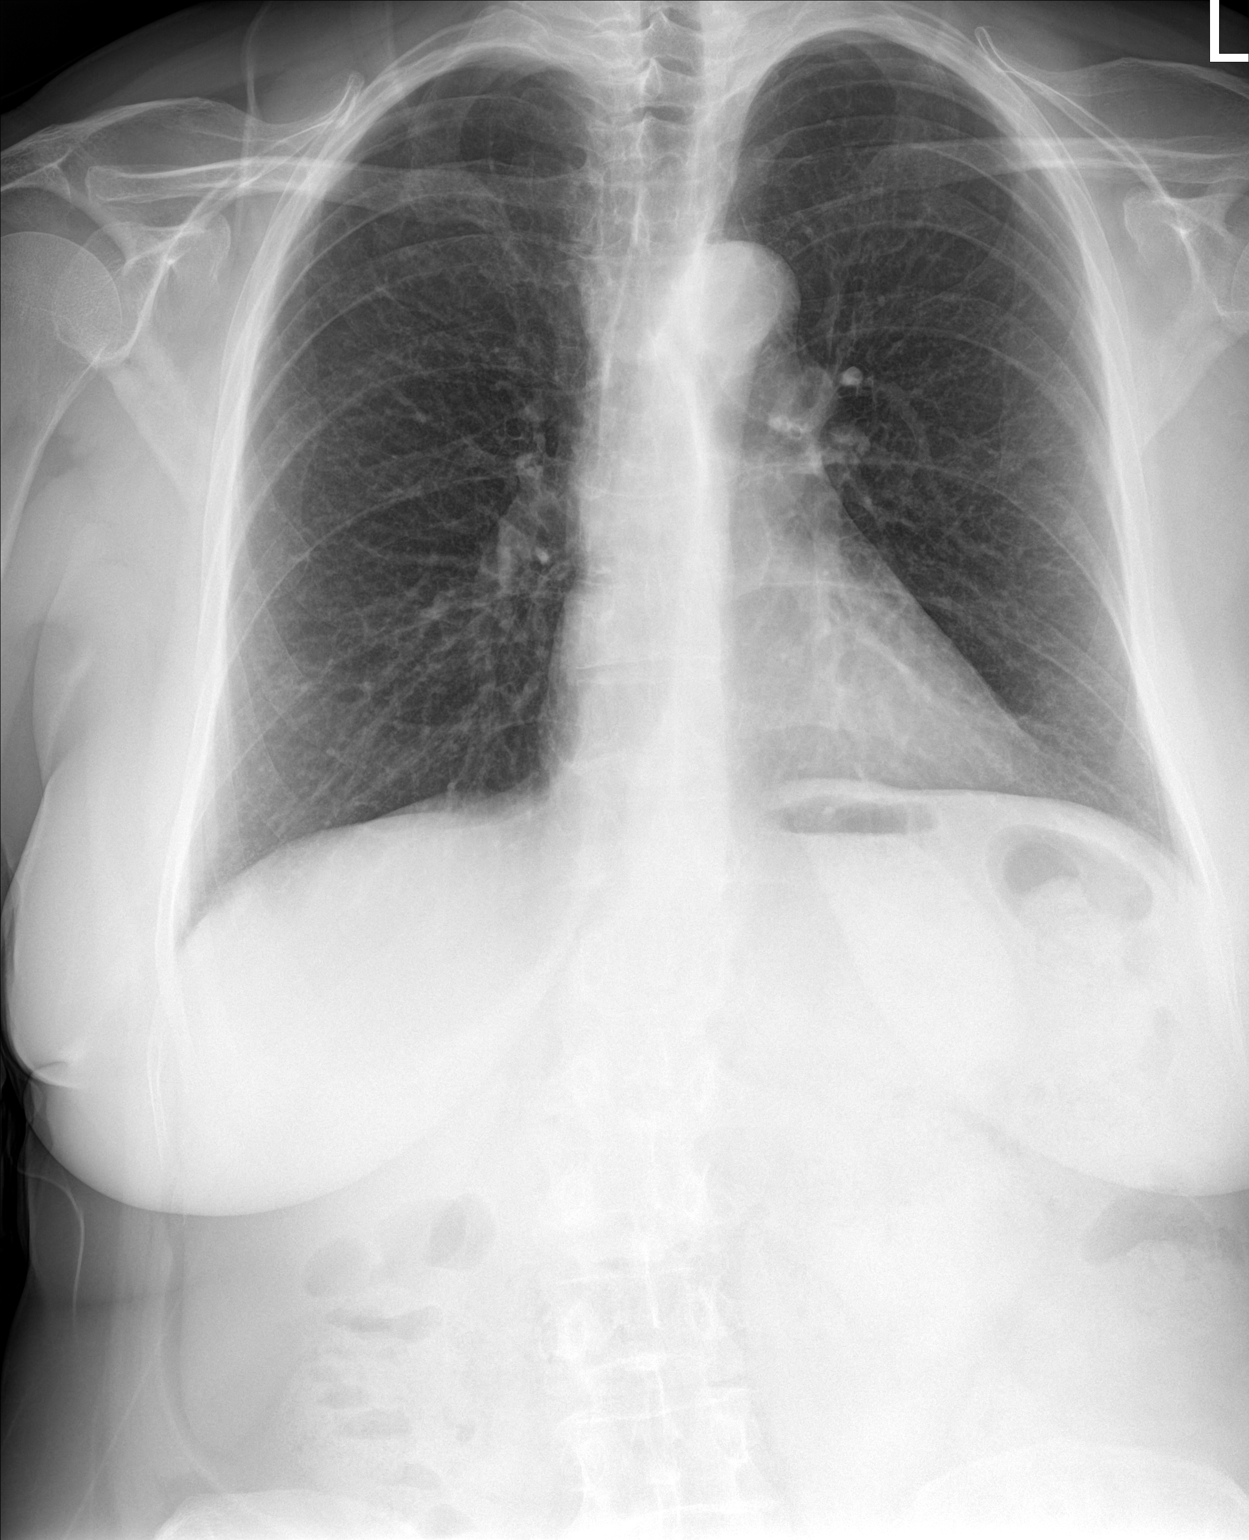

[chest lat]
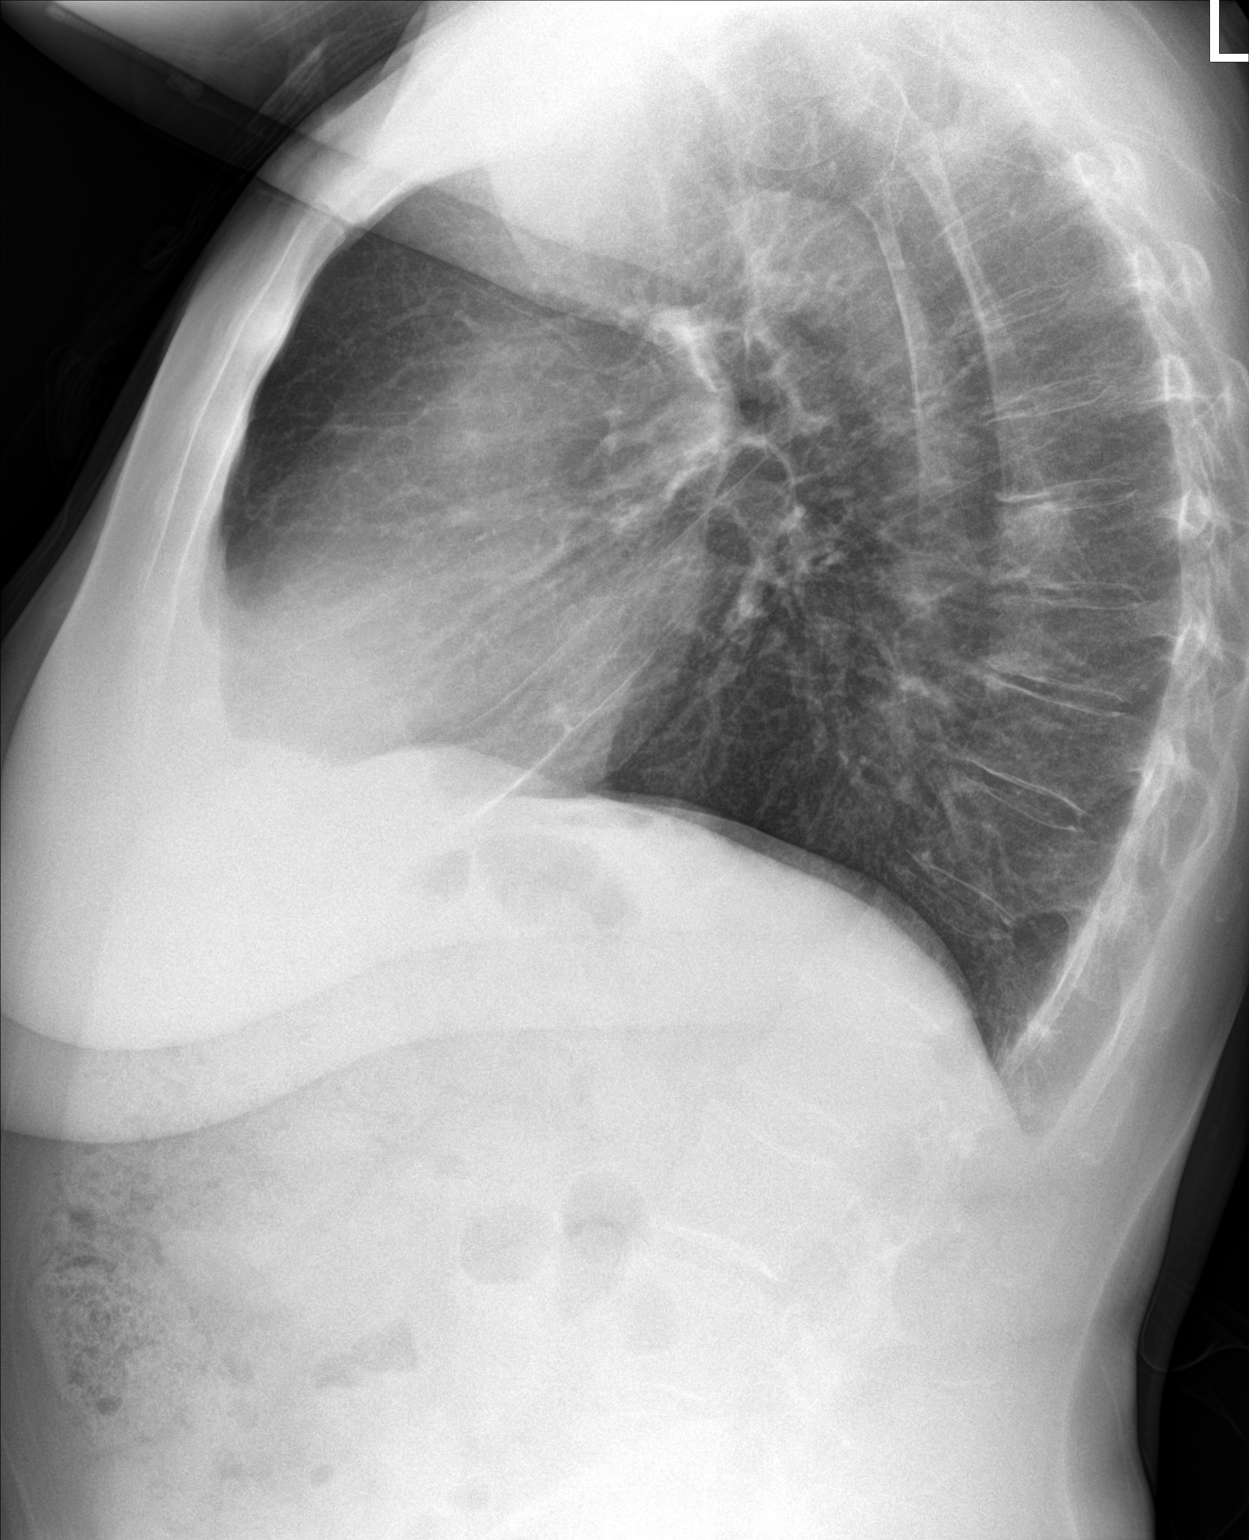

[2 of 2 positions shown; findings below may reference images not displayed]

FINDINGS: Normal heart size, mediastinal contours, and pulmonary vascularity.

Lungs slightly hyperinflated but clear.

No infiltrate, pleural effusion, or pneumothorax.

No acute osseous findings.
IMPRESSION: No acute abnormalities.

## 2022-10-26 ENCOUNTER — Telehealth: Payer: Self-pay | Admitting: Family Medicine

## 2022-10-26 ENCOUNTER — Ambulatory Visit: Payer: No Typology Code available for payment source | Admitting: Nurse Practitioner

## 2022-10-26 ENCOUNTER — Encounter: Payer: Self-pay | Admitting: Nurse Practitioner

## 2022-10-26 ENCOUNTER — Ambulatory Visit (INDEPENDENT_AMBULATORY_CARE_PROVIDER_SITE_OTHER): Payer: No Typology Code available for payment source

## 2022-10-26 VITALS — BP 134/90 | HR 92 | Temp 97.2°F | Resp 20 | Ht 66.0 in | Wt 187.0 lb

## 2022-10-26 DIAGNOSIS — J40 Bronchitis, not specified as acute or chronic: Secondary | ICD-10-CM

## 2022-10-26 DIAGNOSIS — R0981 Nasal congestion: Secondary | ICD-10-CM | POA: Diagnosis not present

## 2022-10-26 LAB — VERITOR FLU A/B WAIVED
Influenza A: NEGATIVE
Influenza B: NEGATIVE

## 2022-10-26 MED ORDER — PROMETHAZINE-DM 6.25-15 MG/5ML PO SYRP: 5.0000 mL | ORAL_SOLUTION | Freq: Four times a day (QID) | ORAL | 0 refills | Status: DC | PRN

## 2022-10-26 MED ORDER — PREDNISONE 20 MG PO TABS: 40.0000 mg | ORAL_TABLET | Freq: Every day | ORAL | 0 refills | Status: AC

## 2022-10-26 NOTE — Progress Notes (Signed)
Subjective:    Patient ID: Melanie Alvarado, female    DOB: [birth year], 61 y.o.   MRN: [id number]   Chief Complaint: cough  Cough This is a new problem. The current episode started 1 to 4 weeks ago. The problem has been gradually worsening. The problem occurs constantly. The cough is Productive of sputum. Associated symptoms include ear congestion, headaches, nasal congestion and rhinorrhea. Pertinent negatives include no chills, ear pain, fever, sore throat or shortness of breath. Nothing aggravates the symptoms. Treatments tried: dayquil. The treatment provided mild relief.        Review of Systems  Constitutional:  Negative for chills and fever.  HENT:  Positive for rhinorrhea. Negative for ear pain and sore throat.   Respiratory:  Positive for cough. Negative for shortness of breath.   Neurological:  Positive for headaches.       Objective:   Physical Exam Constitutional:      Appearance: Normal appearance.  Cardiovascular:     Rate and Rhythm: Normal rate and regular rhythm.     Heart sounds: Normal heart sounds.  Pulmonary:     Effort: Pulmonary effort is normal. No respiratory distress.     Breath sounds: Normal breath sounds. No wheezing or rales.  Skin:    General: Skin is warm.  Neurological:     General: No focal deficit present.     Mental Status: She is alert and oriented to person, place, and time.  Psychiatric:        Mood and Affect: Mood normal.        Behavior: Behavior normal.      Chest xray- no acute findings  BP (!) 134/90   Pulse 92   Temp (!) 97.2 F (36.2 C) (Temporal)   Resp 20   Ht 5\' 6"  (1.676 m)   Wt 187 lb (84.8 kg)   LMP 01/19/2013   SpO2 97%   BMI 30.18 kg/m      Assessment & Plan:   Melanie Alvarado in today with chief complaint of Cough   1. Nasal congestion Flu negative - Veritor Flu A/B Waived - Novel Coronavirus, NAA ([facility]) - DG Chest 2 View  2. Bronchitis  1. Take meds as prescribed 2. Use a cool mist  humidifier especially during the winter months and when heat has been humid. 3. Use saline nose sprays frequently 4. Saline irrigations of the nose can be very helpful if done frequently.  * 4X daily for 1 week*  * Use of a nettie pot can be helpful with this. Follow directions with this* 5. Drink plenty of fluids 6. Keep thermostat turn down low 7.For any cough or congestion- promethazine dm 8. For fever or aces or pains- take tylenol or ibuprofen appropriate for age and weight.  * for fevers greater than 101 orally you may alternate ibuprofen and tylenol every  3 hours.    Meds ordered this encounter  Medications   predniSONE (DELTASONE) 20 MG tablet    Sig: Take 2 tablets (40 mg total) by mouth daily with breakfast for 5 days. 2 po daily for 5 days    Dispense:  10 tablet    Refill:  0    Order Specific Question:   Supervising Provider    Answer:   [birth year]1986 A [[id number]]   promethazine-dextromethorphan (PROMETHAZINE-DM) 6.25-15 MG/5ML syrup    Sig: Take 5 mLs by mouth 4 (four) times daily as needed for cough.    Dispense:  118 mL  Refill:  0    Order Specific Question:   Supervising Provider    Answer:   Arville Care A [id number]      The above assessment and management plan was discussed with the patient. The patient verbalized understanding of and has agreed to the management plan. Patient is aware to call the clinic if symptoms persist or worsen. Patient is aware when to return to the clinic for a follow-up visit. Patient educated on when it is appropriate to go to the emergency department.   Mary-Margaret Daphine Deutscher, FNP

## 2022-10-26 NOTE — Telephone Encounter (Signed)
Patient retested herself for COVID and it came back positive. Wanted to know if something can be called in to [facility] in [place] for this. Please call back.

## 2022-10-26 NOTE — Patient Instructions (Signed)
1. Take meds as prescribed 2. Use a cool mist humidifier especially during the winter months and when heat has been humid. 3. Use saline nose sprays frequently 4. Saline irrigations of the nose can be very helpful if done frequently.  * 4X daily for 1 week*  * Use of a nettie pot can be helpful with this. Follow directions with this* 5. Drink plenty of fluids 6. Keep thermostat turn down low 7.For any cough or congestion- promethazine dm 8. For fever or aces or pains- take tylenol or ibuprofen appropriate for age and weight.  * for fevers greater than 101 orally you may alternate ibuprofen and tylenol every  3 hours.    

## 2022-10-27 LAB — NOVEL CORONAVIRUS, NAA: SARS-CoV-2, NAA: DETECTED — AB

## 2022-10-27 MED ORDER — MOLNUPIRAVIR EUA 200MG CAPSULE: 4.0000 | ORAL_CAPSULE | Freq: Two times a day (BID) | ORAL | 0 refills | Status: AC

## 2022-10-27 MED ORDER — MOLNUPIRAVIR EUA 200MG CAPSULE: 4.0000 | ORAL_CAPSULE | Freq: Two times a day (BID) | ORAL | 0 refills | Status: DC

## 2022-10-27 NOTE — Telephone Encounter (Signed)
Pt seen MMM yesterday 12/6. Will have Gennette Pac to review for prescription. Tests performed in office are still pending.

## 2022-10-27 NOTE — Addendum Note (Signed)
Addended by: Bennie Pierini on: 10/27/2022 04:54 PM   Modules accepted: Orders

## 2022-10-27 NOTE — Addendum Note (Signed)
Addended by: Bennie Pierini on: 10/27/2022 04:53 PM   Modules accepted: Orders

## 2022-11-02 ENCOUNTER — Telehealth: Payer: Self-pay | Admitting: Family Medicine

## 2022-11-02 NOTE — Telephone Encounter (Signed)
Has she been using flonase nasal sray and taking OTC decongestant?

## 2022-11-02 NOTE — Telephone Encounter (Signed)
Patient was seen on 12/6 and was called in medicine for COVID, she said that she is still having sinus pressure that is not getting better and wants to know if something else can be called in. Please call back and advise.

## 2022-11-02 NOTE — Telephone Encounter (Signed)
Patient states she has been using both with no relief. States that her front teeth are very sensitive

## 2022-11-03 NOTE — Telephone Encounter (Signed)
Either ntbs for antibiotics or she can do evisit

## 2022-11-03 NOTE — Telephone Encounter (Signed)
Televisit scheduled for 12/15 at 3:35pm

## 2022-11-04 ENCOUNTER — Encounter: Payer: Self-pay | Admitting: Nurse Practitioner

## 2022-11-04 ENCOUNTER — Ambulatory Visit (INDEPENDENT_AMBULATORY_CARE_PROVIDER_SITE_OTHER): Payer: No Typology Code available for payment source | Admitting: Nurse Practitioner

## 2022-11-04 DIAGNOSIS — U099 Post covid-19 condition, unspecified: Secondary | ICD-10-CM

## 2022-11-04 DIAGNOSIS — R053 Chronic cough: Secondary | ICD-10-CM | POA: Diagnosis not present

## 2022-11-04 DIAGNOSIS — J0100 Acute maxillary sinusitis, unspecified: Secondary | ICD-10-CM | POA: Diagnosis not present

## 2022-11-04 MED ORDER — AMOXICILLIN-POT CLAVULANATE 875-125 MG PO TABS: 1.0000 | ORAL_TABLET | Freq: Two times a day (BID) | ORAL | 0 refills | Status: DC

## 2022-11-04 NOTE — Progress Notes (Signed)
   Virtual Visit  Note Due to COVID-19 pandemic this visit was conducted virtually. This visit type was conducted due to national recommendations for restrictions regarding the COVID-19 Pandemic (e.g. social distancing, sheltering in place) in an effort to limit this patient's exposure and mitigate transmission in our community. All issues noted in this document were discussed and addressed.  A physical exam was not performed with this format.  I connected with Melanie Alvarado on 11/04/22 at 8:20 by telephone and verified that I am speaking with the correct person using two identifiers. Melanie Alvarado is currently located at work and no one is currently with her during visit. The provider, Mary-Margaret Daphine Deutscher, FNP is located in their office at time of visit.  I discussed the limitations, risks, security and privacy concerns of performing an evaluation and management service by telephone and the availability of in person appointments. I also discussed with the patient that there may be a patient responsible charge related to this service. The patient expressed understanding and agreed to proceed.   History and Present Illness:  Patient had covid last week. She was prescribed molnupivir.   URI  This is a new problem. The current episode started in the past 7 days. The problem has been waxing and waning. Associated symptoms include congestion, sinus pain and sneezing. Pertinent negatives include no coughing. Associated symptoms comments: Teeth hurt to chew.      Review of Systems  HENT:  Positive for congestion, sinus pain and sneezing.   Respiratory:  Negative for cough.      Observations/Objective: Alert and oriented- answers all questions appropriately No distress Raspy voice Bil maxillary sinus pressure Deep cough  Assessment and Plan: Melanie Alvarado in today with chief complaint of URI   1. Acute non-recurrent maxillary sinusitis 2.  Post-COVID chronic cough 1. Take meds as  prescribed 2. Use a cool mist humidifier especially during the winter months and when heat has been humid. 3. Use saline nose sprays frequently 4. Saline irrigations of the nose can be very helpful if done frequently.  * 4X daily for 1 week*  * Use of a nettie pot can be helpful with this. Follow directions with this* 5. Drink plenty of fluids 6. Keep thermostat turn down low 7.For any cough or congestion- mucinexOTC 8. For fever or aces or pains- take tylenol or ibuprofen appropriate for age and weight.  * for fevers greater than 101 orally you may alternate ibuprofen and tylenol every  3 hours.    Follow Up Instructions: prn    I discussed the assessment and treatment plan with the patient. The patient was provided an opportunity to ask questions and all were answered. The patient agreed with the plan and demonstrated an understanding of the instructions.   The patient was advised to call back or seek an in-person evaluation if the symptoms worsen or if the condition fails to improve as anticipated.  The above assessment and management plan was discussed with the patient. The patient verbalized understanding of and has agreed to the management plan. Patient is aware to call the clinic if symptoms persist or worsen. Patient is aware when to return to the clinic for a follow-up visit. Patient educated on when it is appropriate to go to the emergency department.   Time call ended:  8:33  I provided 13 minutes of  non face-to-face time during this encounter.    Mary-Margaret Daphine Deutscher, FNP

## 2022-11-04 NOTE — Patient Instructions (Signed)

## 2022-11-17 ENCOUNTER — Telehealth: Payer: Self-pay | Admitting: Nurse Practitioner

## 2022-11-17 MED ORDER — FLUCONAZOLE 150 MG PO TABS: 150.0000 mg | ORAL_TABLET | Freq: Once | ORAL | 0 refills | Status: AC

## 2022-11-17 NOTE — Telephone Encounter (Signed)
PT AWARE  

## 2022-11-17 NOTE — Telephone Encounter (Signed)
Diflucan sent to pharmacy  Meds ordered this encounter  Medications   fluconazole (DIFLUCAN) 150 MG tablet    Sig: Take 1 tablet (150 mg total) by mouth once for 1 dose.    Dispense:  1 tablet    Refill:  0    Order Specific Question:   Supervising Provider    Answer:   Nils Pyle [[id number]]   Mary-Margaret Daphine Deutscher, FNP

## 2022-11-17 NOTE — Telephone Encounter (Signed)
Pt called stating that she recently had a visit with MMM and was prescribed an antibiotic. Pt says she now has a yeast infection from taking the antibiotic and wants to know if MMM can send in a Rx for yeast infection.

## 2023-02-06 ENCOUNTER — Other Ambulatory Visit: Payer: Self-pay | Admitting: Obstetrics & Gynecology

## 2023-02-06 DIAGNOSIS — Z1231 Encounter for screening mammogram for malignant neoplasm of breast: Secondary | ICD-10-CM

## 2023-03-15 ENCOUNTER — Telehealth: Payer: No Typology Code available for payment source | Admitting: Family Medicine

## 2023-03-15 ENCOUNTER — Encounter: Payer: Self-pay | Admitting: Family Medicine

## 2023-03-15 DIAGNOSIS — J40 Bronchitis, not specified as acute or chronic: Secondary | ICD-10-CM | POA: Diagnosis not present

## 2023-03-15 DIAGNOSIS — J329 Chronic sinusitis, unspecified: Secondary | ICD-10-CM | POA: Diagnosis not present

## 2023-03-15 MED ORDER — TRIAMCINOLONE ACETONIDE 55 MCG/ACT NA AERO: 2.0000 | INHALATION_SPRAY | Freq: Every day | NASAL | 12 refills | Status: DC

## 2023-03-15 MED ORDER — FEXOFENADINE-PSEUDOEPHED ER 180-240 MG PO TB24: 1.0000 | ORAL_TABLET | Freq: Every evening | ORAL | 11 refills | Status: DC

## 2023-03-15 MED ORDER — AMOXICILLIN-POT CLAVULANATE 875-125 MG PO TABS: 1.0000 | ORAL_TABLET | Freq: Two times a day (BID) | ORAL | 0 refills | Status: DC

## 2023-03-15 MED ORDER — PREDNISONE 10 MG PO TABS: ORAL_TABLET | ORAL | 0 refills | Status: DC

## 2023-03-15 NOTE — Progress Notes (Signed)
Subjective:    Patient ID: Melanie Alvarado, female    DOB: 1961/04/11, 62 y.o.   MRN: [id number]   HPI: Melanie Alvarado is a 62 y.o. female presenting for 3 weeks of cough after working in the yard. Taking allergy med. Cough getting worse in spite of allergy meds in use.Eyes itching, congested. Productive of yellow green sputumDyspnea since having Covid 4-5 months ago.        05/03/2022   11:20 AM 09/22/2020    2:20 PM 04/23/2020    8:17 AM 05/23/2019    9:59 AM 06/27/2016    4:23 PM  Depression screen PHQ 2/9  Decreased Interest 0 0 0 0 0  Down, Depressed, Hopeless 0 0 0 0 0  PHQ - 2 Score 0 0 0 0 0  Altered sleeping 3      Tired, decreased energy 2      Change in appetite 2      Feeling bad or failure about yourself  0      Trouble concentrating 0      Moving slowly or fidgety/restless 0      Suicidal thoughts 0      PHQ-9 Score 7      Difficult doing work/chores Not difficult at all         Relevant past medical, surgical, family and social history reviewed and updated as indicated.  Interim medical history since our last visit reviewed. Allergies and medications reviewed and updated.  ROS:  Review of Systems  Constitutional:  Negative for activity change, appetite change, chills and fever.  HENT:  Positive for congestion, postnasal drip, rhinorrhea and sinus pressure. Negative for ear discharge, ear pain, hearing loss, nosebleeds, sneezing and trouble swallowing.   Respiratory:  Negative for chest tightness and shortness of breath.   Cardiovascular:  Negative for chest pain and palpitations.  Skin:  Negative for rash.     Social History   Tobacco Use  Smoking Status Former   Types: Cigarettes   Quit date: 08/22/1993   Years since quitting: 29.5  Smokeless Tobacco Never       Objective:     Wt Readings from Last 3 Encounters:  10/26/22 187 lb (84.8 kg)  06/24/22 189 lb (85.7 kg)  05/03/22 191 lb (86.6 kg)     Video visit  Assessment & Plan:   1.  Sinobronchitis     Meds ordered this encounter  Medications   amoxicillin-clavulanate (AUGMENTIN) 875-125 MG tablet    Sig: Take 1 tablet by mouth 2 (two) times daily. Take all of this medication    Dispense:  20 tablet    Refill:  0   predniSONE (DELTASONE) 10 MG tablet    Sig: Take 5 daily for 2 days followed by 4,3,2 and 1 for 2 days each.    Dispense:  30 tablet    Refill:  0   fexofenadine-pseudoephedrine (ALLEGRA-D 24) 180-240 MG 24 hr tablet    Sig: Take 1 tablet by mouth every evening. For allergy and congestion    Dispense:  30 tablet    Refill:  11   triamcinolone (NASACORT ALLERGY 24HR) 55 MCG/ACT AERO nasal inhaler    Sig: Place 2 sprays into the nose daily.    Dispense:  1 each    Refill:  12        Diagnoses and all orders for this visit:  Sinobronchitis  Other orders -     amoxicillin-clavulanate (AUGMENTIN) 875-125 MG tablet; Take  1 tablet by mouth 2 (two) times daily. Take all of this medication -     predniSONE (DELTASONE) 10 MG tablet; Take 5 daily for 2 days followed by 4,3,2 and 1 for 2 days each. -     fexofenadine-pseudoephedrine (ALLEGRA-D 24) 180-240 MG 24 hr tablet; Take 1 tablet by mouth every evening. For allergy and congestion -     triamcinolone (NASACORT ALLERGY 24HR) 55 MCG/ACT AERO nasal inhaler; Place 2 sprays into the nose daily.    Virtual Visit via telephone Note  I discussed the limitations, risks, security and privacy concerns of performing an evaluation and management service by telephone and the availability of in person appointments. The patient was identified with two identifiers. Pt.expressed understanding and agreed to proceed. Pt. Is at home. Dr. Darlyn Read is in his office.  Follow Up Instructions:   I discussed the assessment and treatment plan with the patient. The patient was provided an opportunity to ask questions and all were answered. The patient agreed with the plan and demonstrated an understanding of the instructions.    The patient was advised to call back or seek an in-person evaluation if the symptoms worsen or if the condition fails to improve as anticipated.   Total minutes including chart review and phone contact time: 13   Follow up plan: Return if symptoms worsen or fail to improve.  Melanie Claude, MD [place] [facility]

## 2023-03-22 ENCOUNTER — Ambulatory Visit
Admission: RE | Admit: 2023-03-22 | Discharge: 2023-03-22 | Disposition: A | Discharge status: Home / Self Care | Payer: No Typology Code available for payment source | Source: Ambulatory Visit | Attending: Obstetrics & Gynecology | Admitting: Obstetrics & Gynecology

## 2023-03-22 DIAGNOSIS — Z1231 Encounter for screening mammogram for malignant neoplasm of breast: Secondary | ICD-10-CM

## 2023-03-23 ENCOUNTER — Telehealth: Payer: Self-pay | Admitting: Family Medicine

## 2023-03-23 NOTE — Telephone Encounter (Signed)
Have patient take Mucinex and use Flonase and use an allergy medicine.  She was treated with a good antibiotic and prednisone, still not any better she may need to come back and be seen and possibly get an x-ray.  If she is better but just not resolved then take the medicines that I told her.

## 2023-03-23 NOTE — Telephone Encounter (Signed)
Pt has been using mucinex, flonase, and allegra. States that she is not improving and still has a nasty tasting cough.   Appt scheduled with MMM 5/3 at 10:30. Dettinger did not have any in office appts.

## 2023-03-23 NOTE — Telephone Encounter (Signed)
Patient had a video appt on 4/24 and said she is not feeling any better after taking the medication. She would like to know if anything else can be done. If more medication is sent in, please send to [facility]

## 2023-03-24 ENCOUNTER — Ambulatory Visit: Payer: No Typology Code available for payment source | Admitting: Nurse Practitioner

## 2023-03-24 VITALS — BP 112/69 | HR 81 | Temp 97.5°F | Resp 20 | Ht 66.0 in | Wt 194.0 lb

## 2023-03-24 DIAGNOSIS — R058 Other specified cough: Secondary | ICD-10-CM | POA: Diagnosis not present

## 2023-03-24 MED ORDER — HYDROCODONE BIT-HOMATROP MBR 5-1.5 MG/5ML PO SOLN: 5.0000 mL | Freq: Four times a day (QID) | ORAL | 0 refills | Status: DC | PRN

## 2023-03-24 MED ORDER — BENZONATATE 100 MG PO CAPS: 100.0000 mg | ORAL_CAPSULE | Freq: Three times a day (TID) | ORAL | 0 refills | Status: DC | PRN

## 2023-03-24 NOTE — Progress Notes (Signed)
Subjective:    Patient ID: Melanie Alvarado, female    DOB: Oct 03, 1961, 62 y.o.   MRN: [id number]   Chief Complaint: Cough   Cough Associated symptoms include shortness of breath. Pertinent negatives include no chills, fever, sore throat or wheezing.    Patient Active Problem List   Diagnosis Date Noted   Anxiety    Depression    Patient had video visit last week with Dr. Darlyn Read. Was dx with URI. Was given antibiotic, prednisone and cough meds. Patient still c/o cough. She says she never got any cough meds even though is on chart. Cough is waxing and waining. She has been using her husbands albuterol.    Review of Systems  Constitutional:  Negative for chills and fever.  HENT:  Negative for congestion, sinus pressure, sore throat and trouble swallowing.   Respiratory:  Positive for cough and shortness of breath. Negative for wheezing.        Objective:   Physical Exam Constitutional:      Appearance: Normal appearance.  HENT:     Nose: Congestion present. No rhinorrhea.     Mouth/Throat:     Mouth: Mucous membranes are moist.     Pharynx: No oropharyngeal exudate or posterior oropharyngeal erythema.  Eyes:     Pupils: Pupils are equal, round, and reactive to light.  Cardiovascular:     Rate and Rhythm: Normal rate and regular rhythm.     Heart sounds: Normal heart sounds.  Pulmonary:     Effort: Pulmonary effort is normal.     Breath sounds: Normal breath sounds.  Skin:    General: Skin is warm.  Neurological:     General: No focal deficit present.     Mental Status: She is alert and oriented to person, place, and time.  Psychiatric:        Mood and Affect: Mood normal.        Behavior: Behavior normal.    BP 112/69   Pulse 81   Temp (!) 97.5 F (36.4 C) (Temporal)   Resp 20   Ht 5\' 6"  (1.676 m)   Wt 194 lb (88 kg)   LMP 01/19/2013   SpO2 94%   BMI 31.31 kg/m         Assessment & Plan:   Melanie Alvarado in today with chief complaint of  Cough   1. Post-viral cough syndrome 1. Take meds as prescribed 2. Use a cool mist humidifier especially during the winter months and when heat has been humid. 3. Use saline nose sprays frequently 4. Saline irrigations of the nose can be very helpful if done frequently.  * 4X daily for 1 week*  * Use of a nettie pot can be helpful with this. Follow directions with this* 5. Drink plenty of fluids 6. Keep thermostat turn down low 7.For any cough or congestion- tessalon perles during the day and hycodan at night 8. For fever or aces or pains- take tylenol or ibuprofen appropriate for age and weight.  * for fevers greater than 101 orally you may alternate ibuprofen and tylenol every  3 hours.    Meds ordered this encounter  Medications   HYDROcodone bit-homatropine (HYCODAN) 5-1.5 MG/5ML syrup    Sig: Take 5 mLs by mouth every 6 (six) hours as needed for cough.    Dispense:  120 mL    Refill:  0    Order Specific Question:   Supervising Provider    Answer:   Louanne Skye,  JOSHUA A [[id number]]   benzonatate (TESSALON PERLES) 100 MG capsule    Sig: Take 1 capsule (100 mg total) by mouth 3 (three) times daily as needed for cough.    Dispense:  20 capsule    Refill:  0    Order Specific Question:   Supervising Provider    Answer:   Arville Care A [[id number]]      The above assessment and management plan was discussed with the patient. The patient verbalized understanding of and has agreed to the management plan. Patient is aware to call the clinic if symptoms persist or worsen. Patient is aware when to return to the clinic for a follow-up visit. Patient educated on when it is appropriate to go to the emergency department.   Mary-Margaret Daphine Deutscher, FNP

## 2023-03-24 NOTE — Patient Instructions (Signed)
1. Take meds as prescribed 2. Use a cool mist humidifier especially during the winter months and when heat has been humid. 3. Use saline nose sprays frequently 4. Saline irrigations of the nose can be very helpful if done frequently.  * 4X daily for 1 week*  * Use of a nettie pot can be helpful with this. Follow directions with this* 5. Drink plenty of fluids 6. Keep thermostat turn down low 7.For any cough or congestion- tessaolon perles during the day and hycodan at night 8. For fever or aces or pains- take tylenol or ibuprofen appropriate for age and weight.  * for fevers greater than 101 orally you may alternate ibuprofen and tylenol every  3 hours.

## 2023-04-21 ENCOUNTER — Other Ambulatory Visit (HOSPITAL_COMMUNITY)
Admission: RE | Admit: 2023-04-21 | Discharge: 2023-04-21 | Disposition: A | Discharge status: Home / Self Care | Payer: No Typology Code available for payment source | Source: Ambulatory Visit | Attending: Obstetrics & Gynecology | Admitting: Obstetrics & Gynecology

## 2023-04-21 ENCOUNTER — Ambulatory Visit (INDEPENDENT_AMBULATORY_CARE_PROVIDER_SITE_OTHER): Payer: No Typology Code available for payment source | Admitting: Obstetrics & Gynecology

## 2023-04-21 ENCOUNTER — Encounter: Payer: Self-pay | Admitting: Obstetrics & Gynecology

## 2023-04-21 VITALS — BP 120/80 | HR 92 | Temp 98.2°F | Ht 66.25 in | Wt 190.0 lb

## 2023-04-21 DIAGNOSIS — Z01419 Encounter for gynecological examination (general) (routine) without abnormal findings: Secondary | ICD-10-CM

## 2023-04-21 DIAGNOSIS — Z780 Asymptomatic menopausal state: Secondary | ICD-10-CM

## 2023-04-21 DIAGNOSIS — R3915 Urgency of urination: Secondary | ICD-10-CM | POA: Diagnosis not present

## 2023-04-21 NOTE — Progress Notes (Signed)
ROWAN LUEBBE 1961-05-14 [id number]   History:    62 y.o. G2P2L3 Married   RP:  Established patient presenting for annual gyn exam    HPI: Postmenopausal, well on no HRT.  No significant hot flashes or night sweats.  No vaginal bleeding.  Occasionally sexually active. Pap smear Neg 02/2022.  Pap reflex today. History of dysplasia 2009, normal Pap smears since then.  Breasts normal. Mammogram Neg 03/2023. BMI 30.44.  Walking.  Lower calorie/carb diet. Will schedule Colonoscopy this year. Vitamin D deficiency in the past, she is taking supplement.  We will check vitamin D level and full fasting Health labs at f/u.    Past medical history,surgical history, family history and social history were all reviewed and documented in the EPIC chart.  Gynecologic History Patient's last menstrual period was 01/19/2013.  Obstetric History OB History  Gravida Para Term Preterm AB Living  2 2 2     3   SAB IAB Ectopic Multiple Live Births        1      # Outcome Date GA Lbr Len/2nd Weight Sex Delivery Anes PTL Lv  2 Term           1 Term              ROS: A ROS was performed and pertinent positives and negatives are included in the history. GENERAL: No fevers or chills. HEENT: No change in vision, no earache, sore throat or sinus congestion. NECK: No pain or stiffness. CARDIOVASCULAR: No chest pain or pressure. No palpitations. PULMONARY: No shortness of breath, cough or wheeze. GASTROINTESTINAL: No abdominal pain, nausea, vomiting or diarrhea, melena or bright red blood per rectum. GENITOURINARY: No urinary frequency, urgency, hesitancy or dysuria. MUSCULOSKELETAL: No joint or muscle pain, no back pain, no recent trauma. DERMATOLOGIC: No rash, no itching, no lesions. ENDOCRINE: No polyuria, polydipsia, no heat or cold intolerance. No recent change in weight. HEMATOLOGICAL: No anemia or easy bruising or bleeding. NEUROLOGIC: No headache, seizures, numbness, tingling or weakness. PSYCHIATRIC: No  depression, no loss of interest in normal activity or change in sleep pattern.     Exam:   BP 120/80   Pulse 92   Temp 98.2 F (36.8 C) (Oral)   Ht 5' 6.25" (1.683 m)   Wt 190 lb (86.2 kg)   LMP 01/19/2013 Comment: sexually active  SpO2 97%   BMI 30.44 kg/m   Body mass index is 30.44 kg/m.  General appearance : Well developed well nourished female. No acute distress HEENT: Eyes: no retinal hemorrhage or exudates,  Neck supple, trachea midline, no carotid bruits, no thyroidmegaly Lungs: Clear to auscultation, no rhonchi or wheezes, or rib retractions  Heart: Regular rate and rhythm, no murmurs or gallops Breast:Examined in sitting and supine position were symmetrical in appearance, no palpable masses or tenderness,  no skin retraction, no nipple inversion, no nipple discharge, no skin discoloration, no axillary or supraclavicular lymphadenopathy Abdomen: no palpable masses or tenderness, no rebound or guarding Extremities: no edema or skin discoloration or tenderness  Pelvic: Vulva: Normal             Vagina: No gross lesions or discharge  Cervix: No gross lesions or discharge.  Pap reflex done.  Uterus  AV, normal size, shape and consistency, non-tender and mobile  Adnexa  Without masses or tenderness  Anus: Normal   Assessment/Plan:  62 y.o. female for annual exam   1. Encounter for routine gynecological examination with Papanicolaou smear of  cervix Postmenopausal, well on no HRT.  No significant hot flashes or night sweats.  No vaginal bleeding.  Occasionally sexually active. Pap smear Neg 02/2022.  Pap reflex today.  History of dysplasia 2009, normal Pap smears since then.  Breasts normal. Mammogram Neg 03/2023. BMI 30.44.  Walking.  Lower calorie/carb diet. Will schedule Colonoscopy this year. Vitamin D deficiency in the past, she is taking supplement.  We will check vitamin D level and full fasting Health labs at f/u. - Cytology - PAP( [place]) - CBC; Future - Comp  Met (CMET); Future - TSH; Future - Lipid Profile; Future - Vitamin D (25 hydroxy); Future  2. Postmenopause Postmenopausal, well on no HRT.  No significant hot flashes or night sweats.  No vaginal bleeding.  Occasionally sexually active.   3. Urinary urgency Insufficient urine.  Will give a specimen when comes back for Fasting Health Labs. - Urinalysis,Complete w/RFL Culture  Other orders - ASHWAGANDHA PO; Take by mouth.   Genia Del MD, 2:26 PM

## 2023-04-25 ENCOUNTER — Other Ambulatory Visit: Payer: No Typology Code available for payment source

## 2023-04-25 ENCOUNTER — Other Ambulatory Visit: Payer: Self-pay | Admitting: Obstetrics & Gynecology

## 2023-04-25 DIAGNOSIS — Z01419 Encounter for gynecological examination (general) (routine) without abnormal findings: Secondary | ICD-10-CM

## 2023-04-25 LAB — CBC
HCT: 40.8 % (ref 35.0–45.0)
RBC: 4.29 10*6/uL (ref 3.80–5.10)

## 2023-04-25 NOTE — Progress Notes (Signed)
Lab visit for a U/A reflex. U/A Yellow, cloudy, Nit Neg, Pro Neg, WBC 0-5, RBC Neg, Bacteria Few.  U. Culture pending.  Recommend to push water intake.  Will wait on U. Culture to decide if treatment with Antibiotics is indicated.

## 2023-04-26 LAB — CYTOLOGY - PAP: Diagnosis: NEGATIVE

## 2023-04-26 LAB — COMPREHENSIVE METABOLIC PANEL
AG Ratio: 1.7 (calc) (ref 1.0–2.5)
ALT: 15 U/L (ref 6–29)
AST: 20 U/L (ref 10–35)
Albumin: 3.9 g/dL (ref 3.6–5.1)
Alkaline phosphatase (APISO): 98 U/L (ref 37–153)
BUN: 13 mg/dL (ref 7–25)
CO2: 24 mmol/L (ref 20–32)
Calcium: 9.2 mg/dL (ref 8.6–10.4)
Chloride: 107 mmol/L (ref 98–110)
Creat: 0.69 mg/dL (ref 0.50–1.03)
Globulin: 2.3 g/dL (calc) (ref 1.9–3.7)
Glucose, Bld: 93 mg/dL (ref 65–99)
Potassium: 4.4 mmol/L (ref 3.5–5.3)
Sodium: 140 mmol/L (ref 135–146)
Total Bilirubin: 0.7 mg/dL (ref 0.2–1.2)
Total Protein: 6.2 g/dL (ref 6.1–8.1)

## 2023-04-26 LAB — TSH: TSH: 2.15 mIU/L (ref 0.40–4.50)

## 2023-04-26 LAB — CBC
Hemoglobin: 13.5 g/dL (ref 11.7–15.5)
MCH: 31.5 pg (ref 27.0–33.0)
MCHC: 33.1 g/dL (ref 32.0–36.0)
MCV: 95.1 fL (ref 80.0–100.0)
MPV: 11.3 fL (ref 7.5–12.5)
Platelets: 239 10*3/uL (ref 140–400)
RDW: 12.2 % (ref 11.0–15.0)
WBC: 6.9 10*3/uL (ref 3.8–10.8)

## 2023-04-26 LAB — LIPID PANEL
Cholesterol: 177 mg/dL (ref <200)
HDL: 66 mg/dL (ref >=50)
LDL Cholesterol (Calc): 98 mg/dL (calc)
Non-HDL Cholesterol (Calc): 111 mg/dL (calc) (ref <130)
Total CHOL/HDL Ratio: 2.7 (calc) (ref <5.0)
Triglycerides: 52 mg/dL (ref <150)

## 2023-04-26 LAB — VITAMIN D 25 HYDROXY (VIT D DEFICIENCY, FRACTURES): Vit D, 25-Hydroxy: 103 ng/mL — ABNORMAL HIGH (ref 30–100)

## 2023-04-27 LAB — URINALYSIS, COMPLETE W/RFL CULTURE
Bilirubin Urine: NEGATIVE
Glucose, UA: NEGATIVE
Hgb urine dipstick: NEGATIVE
Hyaline Cast: NONE SEEN /LPF
Ketones, ur: NEGATIVE
Nitrites, Initial: NEGATIVE
Protein, ur: NEGATIVE
RBC / HPF: NONE SEEN /HPF (ref 0–2)
Specific Gravity, Urine: 1.02 (ref 1.001–1.035)
pH: 6.5 (ref 5.0–8.0)

## 2023-04-27 LAB — URINE CULTURE
MICRO NUMBER:: 15038389
Result:: NO GROWTH
SPECIMEN QUALITY:: ADEQUATE

## 2023-04-27 LAB — CULTURE INDICATED

## 2023-07-26 ENCOUNTER — Other Ambulatory Visit: Payer: Self-pay

## 2023-07-26 DIAGNOSIS — B009 Herpesviral infection, unspecified: Secondary | ICD-10-CM

## 2023-07-26 MED ORDER — VALACYCLOVIR HCL 1 G PO TABS: ORAL_TABLET | ORAL | 0 refills | Status: DC

## 2023-07-26 NOTE — Telephone Encounter (Signed)
Med refill request: Valtrex Last AEX: 04/21/2023 w/ ML Next AEX: nothing scheduled, recall placed for 03/2024 Last MMG (if hormonal med): n/a Refill authorized: rx pend.

## 2024-04-02 ENCOUNTER — Other Ambulatory Visit: Payer: Self-pay | Admitting: Obstetrics and Gynecology

## 2024-04-02 DIAGNOSIS — Z1231 Encounter for screening mammogram for malignant neoplasm of breast: Secondary | ICD-10-CM

## 2024-05-01 ENCOUNTER — Ambulatory Visit: Admitting: Obstetrics and Gynecology

## 2024-05-01 ENCOUNTER — Encounter: Payer: Self-pay | Admitting: Obstetrics and Gynecology

## 2024-05-01 ENCOUNTER — Ambulatory Visit
Admission: RE | Admit: 2024-05-01 | Discharge: 2024-05-01 | Disposition: A | Discharge status: Home / Self Care | Payer: Self-pay | Source: Ambulatory Visit | Attending: Obstetrics and Gynecology | Admitting: Obstetrics and Gynecology

## 2024-05-01 VITALS — BP 120/82 | HR 95 | Ht 66.0 in | Wt 192.0 lb

## 2024-05-01 DIAGNOSIS — Z1331 Encounter for screening for depression: Secondary | ICD-10-CM

## 2024-05-01 DIAGNOSIS — Z1231 Encounter for screening mammogram for malignant neoplasm of breast: Secondary | ICD-10-CM

## 2024-05-01 DIAGNOSIS — T148XXA Other injury of unspecified body region, initial encounter: Secondary | ICD-10-CM

## 2024-05-01 DIAGNOSIS — Z01419 Encounter for gynecological examination (general) (routine) without abnormal findings: Secondary | ICD-10-CM

## 2024-05-01 DIAGNOSIS — E2839 Other primary ovarian failure: Secondary | ICD-10-CM | POA: Diagnosis not present

## 2024-05-01 DIAGNOSIS — Z1211 Encounter for screening for malignant neoplasm of colon: Secondary | ICD-10-CM

## 2024-05-01 NOTE — Progress Notes (Signed)
 63 y.o. y.o. female here for annual exam. Patient's last menstrual period was 01/19/2013.   G2P2L3 Married   RP:  Established patient presenting for annual gyn exam    HPI: Postmenopausal, well on no HRT. History of endometriosis No significant hot flashes or night sweats.  No vaginal bleeding.  Occasionally sexually active. Pap smear Neg 02/2022.  Pap reflex today. History of dysplasia 2009, normal Pap smears since then.  Breasts normal. Mammogram Neg 03/2023. BMI 30.44 last year.  Walking.  Lower calorie/carb diet. Will schedule Colonoscopy this year. Vitamin D  deficiency in the past, she is taking supplement.  We will check vitamin D  level and full fasting Health labs at f/u. To get stat DXA scan.  Had dual fracture one year ago after falling down one step in her right forarm. Kyphosis seen in back. Also with arthritis.  Will likely need bone support and counseled on options.  Patient is interested in evenity/prolia r/b/a/I discussed.  Will see what the bone scan shows.   Body mass index is 30.99 kg/m.     05/01/2024    1:27 PM 03/24/2023   10:58 AM 05/03/2022   11:20 AM  Depression screen PHQ 2/9  Decreased Interest 0 0 0  Down, Depressed, Hopeless 0 0 0  PHQ - 2 Score 0 0 0  Altered sleeping  2 3  Tired, decreased energy  1 2  Change in appetite  1 2  Feeling bad or failure about yourself   0 0  Trouble concentrating  0 0  Moving slowly or fidgety/restless  0 0  Suicidal thoughts  0 0  PHQ-9 Score  4 7  Difficult doing work/chores  Somewhat difficult Not difficult at all    Blood pressure 120/82, pulse 95, height 5' 6 (1.676 m), weight 192 lb (87.1 kg), last menstrual period 01/19/2013, SpO2 94%.     Component Value Date/Time   DIAGPAP  04/21/2023 1442    - Negative for intraepithelial lesion or malignancy (NILM)   DIAGPAP  02/24/2022 1034    - Negative for intraepithelial lesion or malignancy (NILM)   HPVHIGH Negative 02/24/2022 1034   ADEQPAP  04/21/2023 1442     Satisfactory for evaluation. The presence or absence of an   ADEQPAP  04/21/2023 1442    endocervical/transformation zone component cannot be determined because   ADEQPAP of atrophy. 04/21/2023 1442    GYN HISTORY:    Component Value Date/Time   DIAGPAP  04/21/2023 1442    - Negative for intraepithelial lesion or malignancy (NILM)   DIAGPAP  02/24/2022 1034    - Negative for intraepithelial lesion or malignancy (NILM)   HPVHIGH Negative 02/24/2022 1034   ADEQPAP  04/21/2023 1442    Satisfactory for evaluation. The presence or absence of an   ADEQPAP  04/21/2023 1442    endocervical/transformation zone component cannot be determined because   ADEQPAP of atrophy. 04/21/2023 1442    OB History  Gravida Para Term Preterm AB Living  2 2 2   3   SAB IAB Ectopic Multiple Live Births     1     # Outcome Date GA Lbr Len/2nd Weight Sex Type Anes PTL Lv  2 Term           1 Term             Past Medical History:  Diagnosis Date   Allergy    Arthritis    Varicose veins     Past Surgical History:  Procedure Laterality Date   COLPOSCOPY  2009 approximately   Cervical dysplasia   ENDOMETRIAL ABLATION     HER OPTION   EXCISION OF BASAL CELL  2011   PELVIC LAPAROSCOPY  1996   LASER OF ENDOMETRIOSIS,DRAIN LEFT OV CYST    Current Outpatient Medications on File Prior to Visit  Medication Sig Dispense Refill   celecoxib  (CELEBREX ) 200 MG capsule TAKE 1 CAPSULE BY MOUTH 2 TIMES DAILY (Patient taking differently: daily.) 60 capsule 1   Cetirizine HCl (ZYRTEC PO) Take by mouth.     ENBREL SURECLICK 50 MG/ML injection Inject into the skin.     gabapentin  (NEURONTIN ) 300 MG capsule Take 1 capsule (300 mg total) by mouth daily. (NEEDS TO BE SEEN BEFORE NEXT REFILL) 30 capsule 0   leflunomide (ARAVA) 20 MG tablet Take 20 mg by mouth daily.     Multiple Vitamins-Minerals (ZINC PO) Take by mouth.     valACYclovir  (VALTREX ) 1000 MG tablet Take 2 tablets at onset then repeat in 12 hours 30  tablet 0   Ascorbic Acid (VITAMIN C PO) Take by mouth. (Patient not taking: Reported on 05/01/2024)     ASHWAGANDHA PO Take by mouth. (Patient not taking: Reported on 05/01/2024)     benzonatate  (TESSALON  PERLES) 100 MG capsule Take 1 capsule (100 mg total) by mouth 3 (three) times daily as needed for cough. (Patient not taking: Reported on 05/01/2024) 20 capsule 0   BIOTIN PO Take by mouth. (Patient not taking: Reported on 05/01/2024)     fexofenadine -pseudoephedrine (ALLEGRA-D 24) 180-240 MG 24 hr tablet Take 1 tablet by mouth every evening. For allergy and congestion (Patient not taking: Reported on 05/01/2024) 30 tablet 11   furosemide  (LASIX ) 20 MG tablet TAKE ONE (1) TABLET EACH DAY (Patient not taking: Reported on 05/01/2024) 30 tablet 0   triamcinolone  (NASACORT  ALLERGY 24HR) 55 MCG/ACT AERO nasal inhaler Place 2 sprays into the nose daily. (Patient not taking: Reported on 05/01/2024) 1 each 12   VITAMIN D  PO Take by mouth. (Patient not taking: Reported on 05/01/2024)     No current facility-administered medications on file prior to visit.    Social History   Socioeconomic History   Marital status: Married    Spouse name: Not on file   Number of children: Not on file   Years of education: Not on file   Highest education level: 12th grade  Occupational History   Not on file  Tobacco Use   Smoking status: Former    Current packs/day: 0.00    Types: Cigarettes    Quit date: 08/22/1993    Years since quitting: 30.7    Passive exposure: Never   Smokeless tobacco: Never  Vaping Use   Vaping status: Never Used  Substance and Sexual Activity   Alcohol use: Not Currently   Drug use: No   Sexual activity: Not Currently    Partners: Male    Birth control/protection: Post-menopausal    Comment: 1st intercourse 63 yo-Fewer than 5 partners  Other Topics Concern   Not on file  Social History Narrative   Not on file   Social Drivers of Health   Financial Resource Strain: Low Risk   (03/24/2023)   Overall Financial Resource Strain (CARDIA)    Difficulty of Paying Living Expenses: Not hard at all  Food Insecurity: No Food Insecurity (03/24/2023)   Hunger Vital Sign    Worried About Running Out of Food in the Last Year: Never true    Ran  Out of Food in the Last Year: Never true  Transportation Needs: No Transportation Needs (03/24/2023)   PRAPARE - Administrator, Civil Service (Medical): No    Lack of Transportation (Non-Medical): No  Physical Activity: Insufficiently Active (03/24/2023)   Exercise Vital Sign    Days of Exercise per Week: 3 days    Minutes of Exercise per Session: 40 min  Stress: No Stress Concern Present (03/24/2023)   [employer] of Occupational Health - Occupational Stress Questionnaire    Feeling of Stress : Only a little  Social Connections: Moderately Integrated (03/24/2023)   Social Connection and Isolation Panel [NHANES]    Frequency of Communication with Friends and Family: More than three times a week    Frequency of Social Gatherings with Friends and Family: More than three times a week    Attends Religious Services: 1 to 4 times per year    Active Member of [employer] or Organizations: No    Attends Engineer, structural: Not on file    Marital Status: Married  Catering manager Violence: Not on file    Family History  Problem Relation Age of Onset   Stroke Father    Hyperlipidemia Father    Hypertension Father    Heart disease Mother    Hyperlipidemia Mother    Varicose Veins Mother    Hypertension Paternal Grandmother    Cancer Paternal Grandmother        BONE, LUNG CANCER   Pancreatic cancer Maternal Grandmother    Breast cancer Maternal Grandmother      Allergies  Allergen Reactions   Methohexital Nausea And Vomiting      Patient's last menstrual period was Patient's last menstrual period was 01/19/2013.Aaron Aas            Review of Systems Alls systems reviewed and are negative.     Physical  Exam Constitutional:      Appearance: Normal appearance.  Genitourinary:     Vulva and urethral meatus normal.     No lesions in the vagina.     Right Labia: No rash, lesions or skin changes.    Left Labia: No lesions, skin changes or rash.    No vaginal discharge or tenderness.     No vaginal prolapse present.    No vaginal atrophy present.     Right Adnexa: not tender, not palpable and no mass present.    Left Adnexa: not tender, not palpable and no mass present.    No cervical motion tenderness or discharge.     Uterus is not enlarged, tender or irregular.  Breasts:    Right: Normal.     Left: Normal.  HENT:     Head: Normocephalic.  Neck:     Thyroid: No thyroid mass, thyromegaly or thyroid tenderness.   Cardiovascular:     Rate and Rhythm: Normal rate and regular rhythm.     Heart sounds: Normal heart sounds, S1 normal and S2 normal.  Pulmonary:     Effort: Pulmonary effort is normal.     Breath sounds: Normal breath sounds and air entry.  Abdominal:     General: There is no distension.     Palpations: Abdomen is soft. There is no mass.     Tenderness: There is no abdominal tenderness. There is no guarding or rebound.   Musculoskeletal:        General: Normal range of motion.     Cervical back: Full passive range of motion without pain,  normal range of motion and neck supple. No tenderness.     Right lower leg: No edema.     Left lower leg: No edema.   Neurological:     Mental Status: She is alert.   Skin:    General: Skin is warm.   Psychiatric:        Mood and Affect: Mood normal.        Behavior: Behavior normal.        Thought Content: Thought content normal.  Vitals and nursing note reviewed. Exam conducted with a chaperone present.       A:         Well Woman GYN exam                             P:        Pap smear collected today Encouraged annual mammogram screening Colon cancer screening referral placed.  Counseled on importance DXA ordered  today Counseled on prolia/Evenity if indicated. Likely need with history of recent fracture and clinical findings Labs and immunizations to do with PMD Discussed breast self exams Encouraged healthy lifestyle practices Encouraged Vit D and Calcium   No follow-ups on file.  Reinaldo Caras

## 2024-05-02 ENCOUNTER — Other Ambulatory Visit (HOSPITAL_COMMUNITY)
Admission: RE | Admit: 2024-05-02 | Discharge: 2024-05-02 | Disposition: A | Discharge status: Home / Self Care | Source: Ambulatory Visit | Attending: Obstetrics and Gynecology | Admitting: Obstetrics and Gynecology

## 2024-05-02 ENCOUNTER — Ambulatory Visit: Payer: Self-pay | Admitting: Obstetrics and Gynecology

## 2024-05-02 DIAGNOSIS — Z01419 Encounter for gynecological examination (general) (routine) without abnormal findings: Secondary | ICD-10-CM | POA: Insufficient documentation

## 2024-05-02 LAB — COMPREHENSIVE METABOLIC PANEL WITH GFR
AG Ratio: 1.9 (calc) (ref 1.0–2.5)
ALT: 13 U/L (ref 6–29)
AST: 15 U/L (ref 10–35)
Albumin: 3.7 g/dL (ref 3.6–5.1)
Alkaline phosphatase (APISO): 94 U/L (ref 37–153)
BUN: 13 mg/dL (ref 7–25)
CO2: 26 mmol/L (ref 20–32)
Calcium: 8.6 mg/dL (ref 8.6–10.4)
Chloride: 108 mmol/L (ref 98–110)
Creat: 0.92 mg/dL (ref 0.50–1.05)
Globulin: 1.9 g/dL (ref 1.9–3.7)
Glucose, Bld: 89 mg/dL (ref 65–99)
Potassium: 3.9 mmol/L (ref 3.5–5.3)
Sodium: 140 mmol/L (ref 135–146)
Total Bilirubin: 0.4 mg/dL (ref 0.2–1.2)
Total Protein: 5.6 g/dL — ABNORMAL LOW (ref 6.1–8.1)
eGFR: 71 mL/min/{1.73_m2} (ref >=60)

## 2024-05-02 LAB — PTH, INTACT (ICMA) AND IONIZED CALCIUM
Calcium, Ion: 4.9 mg/dL (ref 4.7–5.5)
Calcium: 8.6 mg/dL (ref 8.6–10.4)
PTH: 44 pg/mL (ref 16–77)

## 2024-05-02 LAB — VITAMIN D 25 HYDROXY (VIT D DEFICIENCY, FRACTURES): Vit D, 25-Hydroxy: 59 ng/mL (ref 30–100)

## 2024-05-03 ENCOUNTER — Ambulatory Visit: Payer: Self-pay | Admitting: Obstetrics and Gynecology

## 2024-05-03 LAB — HM DEXA SCAN

## 2024-05-06 ENCOUNTER — Encounter: Payer: Self-pay | Admitting: Obstetrics and Gynecology

## 2024-05-06 LAB — CYTOLOGY - PAP: Diagnosis: NEGATIVE

## 2024-05-06 MED ORDER — ROMOSOZUMAB-AQQG 105 MG/1.17ML ~~LOC~~ SOSY: 210.0000 mg | PREFILLED_SYRINGE | Freq: Once | SUBCUTANEOUS | Status: AC

## 2024-05-06 NOTE — Telephone Encounter (Signed)
-  2.9 osteoporosis Had wrist fracture 2 years ago with a fall  Embryl approved her for something with [place]  The r/b/a/I of evenity/prolia were discussed in length. She will stay on calcium and vit D.  Severe osteoporosis with recent fracture.  Needs Evenity followed by prolia.  She is agreement. Labs are done   Dr. Tia Flowers

## 2024-05-28 ENCOUNTER — Telehealth: Payer: Self-pay | Admitting: *Deleted

## 2024-05-28 NOTE — Telephone Encounter (Signed)
 In follow up to your request, [employer] has researched Medical insurance benefits for therapy  with Evenity  (romosozumab -aqqg) for your patient.  Melanie Alvarado with DOB [birth year].  [employer] [name]  [phone] Your patient's Medical Health Plan advised that Evenity  is not covered through your patient's Medical  benefit at this time, or that the proposed use of Evenity  is not consistent with the payer's policy and  therefore is not covered, because Product/Service is not a covered benefit.   Routing to provider to review and advise as Evenity  is not covered by this patient's plan.

## 2024-06-06 NOTE — Telephone Encounter (Signed)
Insurance information submitted to [employer] portal. Will await summary of benefits for prolia.    

## 2024-06-12 ENCOUNTER — Ambulatory Visit: Payer: Self-pay

## 2024-06-12 NOTE — Telephone Encounter (Signed)
 Apt scheduled.

## 2024-06-12 NOTE — Telephone Encounter (Signed)
 FYI Only or Action Required?: Action required by provider: request for appointment.  Patient was last seen in primary care on 03/24/2023 by Gladis Mustard, FNP.  Called Nurse Triage reporting Sore Throat.  Symptoms began several days ago.  Interventions attempted: OTC medications: mucinex and allergy medication.  Symptoms are: unchanged.  Triage Disposition: See Physician Within 24 Hours  Patient/caregiver understands and will follow disposition?: YesCopied from CRM [id number]. Topic: Clinical - Red Word Triage >> Jun 12, 2024 11:46 AM Avram MATSU wrote: Red Word that prompted transfer to Nurse Triage: sore throat/pain in ear stuffy nose/sinus Reason for Disposition  Earache also present  Answer Assessment - Initial Assessment Questions 1. ONSET: When did the throat start hurting? (Hours or days ago)      3 days 2. SEVERITY: How bad is the sore throat? (Scale 1-10; mild, moderate or severe)     Nagging pain-7 3. STREP EXPOSURE: Has there been any exposure to strep within the past week? If Yes, ask: What type of contact occurred?      Na  4.  VIRAL SYMPTOMS: Are there any symptoms of a cold, such as a runny nose, cough, hoarse voice or red eyes?      denies 5. FEVER: Do you have a fever? If Yes, ask: What is your temperature, how was it measured, and when did it start?     denies 6. PUS ON THE TONSILS: Is there pus on the tonsils in the back of your throat?     denies 7. OTHER SYMPTOMS: Do you have any other symptoms? (e.g., difficulty breathing, headache, rash)     Right Earache, sinus congestion   Throat hurts on right side. Throat is red/raw. Pt is not having trouble swallowing or breathing . Pt has tried mucinex and allergy medication.  Protocols used: Sore Throat-A-AH

## 2024-06-13 ENCOUNTER — Ambulatory Visit

## 2024-06-14 ENCOUNTER — Telehealth: Payer: Self-pay | Admitting: *Deleted

## 2024-06-14 ENCOUNTER — Ambulatory Visit: Admitting: Family Medicine

## 2024-06-14 ENCOUNTER — Other Ambulatory Visit: Payer: Self-pay | Admitting: Obstetrics and Gynecology

## 2024-06-14 ENCOUNTER — Encounter: Payer: Self-pay | Admitting: Family Medicine

## 2024-06-14 VITALS — BP 116/74 | HR 80 | Ht 66.0 in | Wt 192.0 lb

## 2024-06-14 DIAGNOSIS — J40 Bronchitis, not specified as acute or chronic: Secondary | ICD-10-CM

## 2024-06-14 DIAGNOSIS — J329 Chronic sinusitis, unspecified: Secondary | ICD-10-CM | POA: Diagnosis not present

## 2024-06-14 DIAGNOSIS — R600 Localized edema: Secondary | ICD-10-CM | POA: Diagnosis not present

## 2024-06-14 MED ORDER — FLUTICASONE PROPIONATE 50 MCG/ACT NA SUSP: 1.0000 | Freq: Two times a day (BID) | NASAL | 6 refills | Status: AC | PRN

## 2024-06-14 MED ORDER — DENOSUMAB 60 MG/ML ~~LOC~~ SOSY: 60.0000 mg | PREFILLED_SYRINGE | Freq: Once | SUBCUTANEOUS | 0 refills | Status: AC

## 2024-06-14 MED ORDER — FUROSEMIDE 20 MG PO TABS: 20.0000 mg | ORAL_TABLET | Freq: Every day | ORAL | 0 refills | Status: AC | PRN

## 2024-06-14 NOTE — Progress Notes (Signed)
 BP 116/74   Pulse 80   Ht 5' 6 (1.676 m)   Wt 192 lb (87.1 kg)   LMP 01/19/2013 Comment: sexually active  SpO2 91%   BMI 30.99 kg/m    Subjective:   Patient ID: Melanie Alvarado, female    DOB: January 17, 1961, 63 y.o.   MRN: [id number]  HPI: Melanie Alvarado is a 63 y.o. female presenting on 06/14/2024 for Sore Throat and Ear Pain (right)   HPI Sore throat congestion ear pain Patient is coming in today for sore throat and congestion and ear pain.  She says she has been having some congestion over the past week and then is developed into some sore throat and some ear pain on the right side over the past 4 to 5 days.  She denies any fevers chills or shortness of breath or wheezing.  She has been using Zyrtec and Dical and they have helped a little bit.  She denies any sick contacts that she knows of but she did do 2 home COVID tests and they both came back negative.  Relevant past medical, surgical, family and social history reviewed and updated as indicated. Interim medical history since our last visit reviewed. Allergies and medications reviewed and updated.  Review of Systems  Constitutional:  Negative for chills and fever.  HENT:  Positive for congestion, postnasal drip, rhinorrhea, sinus pressure and sore throat. Negative for ear discharge, ear pain and sneezing.   Eyes:  Negative for pain, redness and visual disturbance.  Respiratory:  Positive for cough. Negative for chest tightness, shortness of breath and wheezing.   Cardiovascular:  Negative for chest pain and leg swelling.  Genitourinary:  Negative for difficulty urinating and dysuria.  Musculoskeletal:  Negative for back pain and gait problem.  Skin:  Negative for rash.  Neurological:  Negative for light-headedness and headaches.  Psychiatric/Behavioral:  Negative for agitation and behavioral problems.   All other systems reviewed and are negative.   Per HPI unless specifically indicated above   Allergies as of 06/14/2024        Reactions   Methohexital Nausea And Vomiting        Medication List        Accurate as of June 14, 2024 10:44 AM. If you have any questions, ask your nurse or doctor.          STOP taking these medications    ASHWAGANDHA PO Stopped by: Fonda LABOR Reniah Cottingham   benzonatate  100 MG capsule Commonly known as: Tessalon  Perles Stopped by: Fonda LABOR Ricquel Foulk   BIOTIN PO Stopped by: Fonda LABOR Aarya Quebedeaux   celecoxib  200 MG capsule Commonly known as: CELEBREX  Stopped by: Fonda LABOR Tannya Gonet   fexofenadine -pseudoephedrine 180-240 MG 24 hr tablet Commonly known as: ALLEGRA-D 24 Stopped by: Fonda LABOR Keigo Whalley   triamcinolone  55 MCG/ACT Aero nasal inhaler Commonly known as: Nasacort  Allergy 24HR Stopped by: Fonda LABOR Juaquina Machnik   VITAMIN C PO Stopped by: Fonda LABOR Damiah Mcdonald   VITAMIN D  PO Stopped by: Fonda LABOR Khyrin Trevathan   ZINC PO Stopped by: Fonda LABOR Myka Lukins       TAKE these medications    calcium carbonate 1500 (600 Ca) MG Tabs tablet Commonly known as: OSCAL Take 1,500 mg by mouth daily with breakfast.   Enbrel SureClick 50 MG/ML injection Generic drug: etanercept Inject into the skin.   fluticasone 50 MCG/ACT nasal spray Commonly known as: FLONASE Place 1 spray into both nostrils 2 (two) times daily as needed for allergies or  rhinitis. Started by: Fonda LABOR Rayvin Abid   furosemide  20 MG tablet Commonly known as: LASIX  Take 1 tablet (20 mg total) by mouth daily as needed. What changed: See the new instructions. Changed by: Fonda LABOR Hillari Zumwalt   gabapentin  300 MG capsule Commonly known as: NEURONTIN  Take 1 capsule (300 mg total) by mouth daily. (NEEDS TO BE SEEN BEFORE NEXT REFILL)   leflunomide 20 MG tablet Commonly known as: ARAVA Take 20 mg by mouth daily.   valACYclovir  1000 MG tablet Commonly known as: VALTREX  Take 2 tablets at onset then repeat in 12 hours   ZYRTEC PO Take by mouth.         Objective:   BP 116/74   Pulse 80   Ht 5'  6 (1.676 m)   Wt 192 lb (87.1 kg)   LMP 01/19/2013 Comment: sexually active  SpO2 91%   BMI 30.99 kg/m   Wt Readings from Last 3 Encounters:  06/14/24 192 lb (87.1 kg)  05/01/24 192 lb (87.1 kg)  04/21/23 190 lb (86.2 kg)    Physical Exam Vitals reviewed.  Constitutional:      General: She is not in acute distress.    Appearance: She is well-developed. She is not diaphoretic.  HENT:     Right Ear: Tympanic membrane, ear canal and external ear normal.     Left Ear: Tympanic membrane, ear canal and external ear normal.     Nose: Mucosal edema and rhinorrhea present.     Right Sinus: No maxillary sinus tenderness or frontal sinus tenderness.     Left Sinus: No maxillary sinus tenderness or frontal sinus tenderness.     Mouth/Throat:     Pharynx: Uvula midline. No oropharyngeal exudate or posterior oropharyngeal erythema.     Tonsils: No tonsillar abscesses.  Eyes:     Conjunctiva/sclera: Conjunctivae normal.  Cardiovascular:     Rate and Rhythm: Normal rate and regular rhythm.     Heart sounds: Normal heart sounds. No murmur heard. Pulmonary:     Effort: Pulmonary effort is normal. No respiratory distress.     Breath sounds: Normal breath sounds. No wheezing, rhonchi or rales.  Chest:     Chest wall: No tenderness.  Musculoskeletal:        General: No tenderness. Normal range of motion.  Skin:    General: Skin is warm and dry.     Findings: No rash.  Neurological:     Mental Status: She is alert and oriented to person, place, and time.     Coordination: Coordination normal.  Psychiatric:        Behavior: Behavior normal.       Assessment & Plan:   Problem List Items Addressed This Visit   None Visit Diagnoses       Sinobronchitis    -  Primary   Relevant Medications   fluticasone (FLONASE) 50 MCG/ACT nasal spray     Localized edema       Relevant Medications   furosemide  (LASIX ) 20 MG tablet       Likely congestion and likely viral, recommended she use  the Flonase and Benadryl and Zyrtec to help with the symptoms.  She can also use Mucinex Follow up plan: Return if symptoms worsen or fail to improve.  Counseling provided for all of the vaccine components No orders of the defined types were placed in this encounter.   Fonda Levins, MD [facility] 06/14/2024, 10:44 AM

## 2024-06-14 NOTE — Telephone Encounter (Signed)
 Per [employer] support: Please note that there is no coverage for Prolia through the MD Purchase pathway. Specialty Pharmacy is mandatory.   Medication pended for #1, 0RF to [facility]. Please review and sign Dr. Glennon.    Detailed message left for patient advising insurance requires prescription be sent to specialty pharmacy and [facility] would reach out directly with benefits. Advised to return call to office if any additional questions. Will schedule once patient authorizes shipment and is prolia is delivered to our office.

## 2024-07-18 ENCOUNTER — Encounter: Payer: Self-pay | Admitting: Obstetrics and Gynecology

## 2024-09-10 ENCOUNTER — Encounter: Payer: Self-pay | Admitting: *Deleted

## 2024-10-14 ENCOUNTER — Other Ambulatory Visit: Payer: Self-pay | Admitting: *Deleted

## 2024-10-14 MED ORDER — DENOSUMAB 60 MG/ML ~~LOC~~ SOSY
60.0000 mg | PREFILLED_SYRINGE | Freq: Once | SUBCUTANEOUS | Status: AC
  Administered 2024-12-09: 60 mg via SUBCUTANEOUS

## 2024-10-15 ENCOUNTER — Telehealth: Payer: Self-pay

## 2024-10-15 NOTE — Telephone Encounter (Signed)
 Prolia  VOB initiated via [email or web address]  Next Prolia  inj DUE: NEW START

## 2024-10-25 ENCOUNTER — Other Ambulatory Visit (HOSPITAL_COMMUNITY): Payer: Self-pay

## 2024-10-25 NOTE — Telephone Encounter (Signed)
 SABRA

## 2024-10-25 NOTE — Telephone Encounter (Signed)
 Proceed with MTDM services  Med obtained from pharmacy and shipped to clinic:  Pharmacy benefit: Copay $MUST USE SPECIALTY PHARMACY (Paid to pharmacy) Admin Fee: 15% (Pay at clinic)  Prior Auth: APPROVED PA# [id number]  Expiration Date: 10/25/24-10/25/25  # of doses approved: 2   Patient IS eligible for Copay Card. Copay Card can make patient's cost as little as $25. Link to apply: [email or web address]://[email or web address].[email or web address]  ** This summary of benefits is an estimation of the patient's out-of-pocket cost. Exact cost may very based on individual plan coverage.

## 2024-10-25 NOTE — Telephone Encounter (Addendum)
 PHARMACY PA SUBMITTED VIA LATENT. KEY: [id number]   APPROVED -     AUTH #: [id number]  *MUST FILL AT SPECIALTY PHARMACY*

## 2024-10-29 ENCOUNTER — Encounter: Payer: Self-pay | Admitting: Family Medicine

## 2024-10-29 ENCOUNTER — Ambulatory Visit: Admitting: Family Medicine

## 2024-10-29 ENCOUNTER — Ambulatory Visit: Payer: Self-pay

## 2024-10-29 VITALS — BP 117/73 | HR 90 | Temp 95.5°F | Ht 66.0 in | Wt 186.0 lb

## 2024-10-29 DIAGNOSIS — H6591 Unspecified nonsuppurative otitis media, right ear: Secondary | ICD-10-CM

## 2024-10-29 DIAGNOSIS — J069 Acute upper respiratory infection, unspecified: Secondary | ICD-10-CM

## 2024-10-29 DIAGNOSIS — Q383 Other congenital malformations of tongue: Secondary | ICD-10-CM

## 2024-10-29 DIAGNOSIS — R059 Cough, unspecified: Secondary | ICD-10-CM

## 2024-10-29 MED ORDER — AMOXICILLIN-POT CLAVULANATE 875-125 MG PO TABS: 1.0000 | ORAL_TABLET | Freq: Two times a day (BID) | ORAL | 0 refills | Status: AC

## 2024-10-29 MED ORDER — PREDNISONE 20 MG PO TABS: 40.0000 mg | ORAL_TABLET | Freq: Every day | ORAL | 0 refills | Status: AC
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
## 2024-10-29 NOTE — Telephone Encounter (Signed)
Noted  -LS
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
## 2024-11-01 ENCOUNTER — Encounter: Payer: Self-pay | Admitting: *Deleted

## 2024-11-01 MED ORDER — DENOSUMAB 60 MG/ML ~~LOC~~ SOSY: 60.0000 mg | PREFILLED_SYRINGE | Freq: Once | SUBCUTANEOUS | 0 refills | Status: AC

## 2024-11-01 NOTE — Addendum Note (Signed)
 Addended by: MELITON PERKINS T on: 11/01/2024 09:09 AM   Modules accepted: Orders

## 2024-11-01 NOTE — Addendum Note (Signed)
 Addended by: GLENNON ALMARIE POUR on: 11/01/2024 09:35 AM   Modules accepted: Orders

## 2024-11-01 NOTE — Telephone Encounter (Signed)
 Medication pended for Prolia  #1, 0RF to [facility]. Please review and send if appropriate.

## 2024-11-09 ENCOUNTER — Other Ambulatory Visit: Payer: Self-pay | Admitting: Radiology

## 2024-11-09 DIAGNOSIS — B009 Herpesviral infection, unspecified: Secondary | ICD-10-CM

## 2024-11-11 NOTE — Telephone Encounter (Signed)
 Med refill request:   valACYclovir  (VALTREX ) 1000 MG tablet  Start:  07/26/2023 Disp:  30 tablets Refills:  0  Last AEX:  05/01/24 Next AEX:  Not yet scheduled Last MMG (if hormonal med):  N/A Refill authorized? Please Advise.

## 2024-12-06 ENCOUNTER — Ambulatory Visit

## 2024-12-09 ENCOUNTER — Ambulatory Visit (INDEPENDENT_AMBULATORY_CARE_PROVIDER_SITE_OTHER)

## 2024-12-09 DIAGNOSIS — M810 Age-related osteoporosis without current pathological fracture: Secondary | ICD-10-CM

## 2024-12-09 NOTE — Progress Notes (Signed)
 Patient in today for first Prolia  injection. Patient's initial calcium level was obtained on 05/01/24.  Result: 8.6.  Last AEX: 05/01/24 Last BMD: 05/03/24  Injection given in left arm .  Patient tolerated injection well.  Routed to provider for review.
